# Patient Record
Sex: Female | Born: 1990 | Race: White | Hispanic: No | Marital: Married | Smoking: Never smoker
Health system: Southern US, Community
[De-identification: ages and names within clinical notes are randomized; demographics above are authoritative.]

## PROBLEM LIST (undated history)

## (undated) DIAGNOSIS — F319 Bipolar disorder, unspecified: Secondary | ICD-10-CM

## (undated) DIAGNOSIS — K5190 Ulcerative colitis, unspecified, without complications: Secondary | ICD-10-CM

## (undated) DIAGNOSIS — K219 Gastro-esophageal reflux disease without esophagitis: Secondary | ICD-10-CM

---
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
## 2011-02-04 ENCOUNTER — Ambulatory Visit: Payer: Self-pay | Admitting: Gastroenterology

## 2011-02-18 ENCOUNTER — Ambulatory Visit: Payer: Self-pay | Admitting: Gastroenterology

## 2011-07-26 ENCOUNTER — Emergency Department: Payer: Self-pay | Admitting: Internal Medicine

## 2011-07-26 LAB — CBC
Platelet: 340 10*3/uL (ref 150–440)
RDW: 13.1 % (ref 11.5–14.5)
WBC: 13.4 10*3/uL — ABNORMAL HIGH (ref 3.6–11.0)

## 2011-07-26 LAB — COMPREHENSIVE METABOLIC PANEL
Alkaline Phosphatase: 81 U/L (ref 50–136)
Bilirubin,Total: 1.5 mg/dL — ABNORMAL HIGH (ref 0.2–1.0)
Calcium, Total: 9.3 mg/dL (ref 8.5–10.1)
Chloride: 103 mmol/L (ref 98–107)
Co2: 21 mmol/L (ref 21–32)
EGFR (African American): >60
EGFR (Non-African Amer.): >60
SGOT(AST): 25 U/L (ref 15–37)
SGPT (ALT): 21 U/L

## 2011-07-26 LAB — URINALYSIS, COMPLETE
Nitrite: NEGATIVE
Protein: NEGATIVE
Specific Gravity: 1.026 (ref 1.003–1.030)
WBC UR: 3 /HPF (ref 0–5)

## 2011-07-26 LAB — PREGNANCY, URINE: Pregnancy Test, Urine: NEGATIVE m[IU]/mL

## 2013-08-01 ENCOUNTER — Ambulatory Visit: Payer: Self-pay | Admitting: Gastroenterology

## 2013-08-02 LAB — PATHOLOGY REPORT

## 2013-08-28 ENCOUNTER — Emergency Department: Payer: Self-pay | Admitting: Emergency Medicine

## 2013-08-28 LAB — CBC WITH DIFFERENTIAL/PLATELET
BASOS ABS: 0 10*3/uL (ref 0.0–0.1)
BASOS PCT: 0.4 %
EOS PCT: 0.6 %
Eosinophil #: 0 10*3/uL (ref 0.0–0.7)
HCT: 35.2 % (ref 35.0–47.0)
HGB: 11.5 g/dL — ABNORMAL LOW (ref 12.0–16.0)
Lymphocyte #: 1.4 10*3/uL (ref 1.0–3.6)
Lymphocyte %: 20.4 %
MCH: 26.5 pg (ref 26.0–34.0)
MCHC: 32.6 g/dL (ref 32.0–36.0)
MCV: 81 fL (ref 80–100)
MONO ABS: 1.1 x10 3/mm — AB (ref 0.2–0.9)
Monocyte %: 16.8 %
NEUTROS PCT: 61.8 %
Neutrophil #: 4.1 10*3/uL (ref 1.4–6.5)
Platelet: 294 10*3/uL (ref 150–440)
RBC: 4.34 10*6/uL (ref 3.80–5.20)
RDW: 13.8 % (ref 11.5–14.5)
WBC: 6.6 10*3/uL (ref 3.6–11.0)

## 2013-08-28 LAB — COMPREHENSIVE METABOLIC PANEL
ALK PHOS: 70 U/L
ALT: 29 U/L (ref 12–78)
ANION GAP: 8 (ref 7–16)
AST: 33 U/L (ref 15–37)
Albumin: 3.4 g/dL (ref 3.4–5.0)
BUN: 9 mg/dL (ref 7–18)
Bilirubin,Total: 0.7 mg/dL (ref 0.2–1.0)
CHLORIDE: 108 mmol/L — AB (ref 98–107)
Calcium, Total: 8.1 mg/dL — ABNORMAL LOW (ref 8.5–10.1)
Co2: 22 mmol/L (ref 21–32)
Creatinine: 0.64 mg/dL (ref 0.60–1.30)
EGFR (Non-African Amer.): >60
GLUCOSE: 95 mg/dL (ref 65–99)
OSMOLALITY: 274 (ref 275–301)
Potassium: 3.3 mmol/L — ABNORMAL LOW (ref 3.5–5.1)
SODIUM: 138 mmol/L (ref 136–145)
TOTAL PROTEIN: 7.8 g/dL (ref 6.4–8.2)

## 2013-08-28 LAB — URINALYSIS, COMPLETE
BACTERIA: NONE SEEN
BILIRUBIN, UR: NEGATIVE
GLUCOSE, UR: NEGATIVE mg/dL (ref 0–75)
Ketone: NEGATIVE
Nitrite: NEGATIVE
PH: 6 (ref 4.5–8.0)
PROTEIN: NEGATIVE
RBC,UR: 367 /HPF (ref 0–5)
Specific Gravity: 1.014 (ref 1.003–1.030)
Squamous Epithelial: 2

## 2013-08-28 LAB — LIPASE, BLOOD: Lipase: 118 U/L (ref 73–393)

## 2013-09-27 LAB — CBC
HCT: 35.9 % (ref 35.0–47.0)
HGB: 11.8 g/dL — ABNORMAL LOW (ref 12.0–16.0)
MCH: 26.9 pg (ref 26.0–34.0)
MCHC: 32.9 g/dL (ref 32.0–36.0)
MCV: 82 fL (ref 80–100)
Platelet: 344 10*3/uL (ref 150–440)
RBC: 4.4 10*6/uL (ref 3.80–5.20)
RDW: 13.7 % (ref 11.5–14.5)
WBC: 10.4 10*3/uL (ref 3.6–11.0)

## 2013-09-27 LAB — URINALYSIS, COMPLETE
Bilirubin,UR: NEGATIVE
Glucose,UR: NEGATIVE mg/dL (ref 0–75)
Ketone: NEGATIVE
NITRITE: NEGATIVE
PH: 7 (ref 4.5–8.0)
PROTEIN: NEGATIVE
RBC,UR: NONE SEEN /HPF (ref 0–5)
Specific Gravity: 1.004 (ref 1.003–1.030)
WBC UR: 4 /HPF (ref 0–5)

## 2013-09-27 LAB — DRUG SCREEN, URINE

## 2013-09-27 LAB — ETHANOL: Ethanol: <3 mg/dL

## 2013-09-27 LAB — COMPREHENSIVE METABOLIC PANEL
ALT: 21 U/L (ref 12–78)
ANION GAP: 6 — AB (ref 7–16)
Albumin: 3.2 g/dL — ABNORMAL LOW (ref 3.4–5.0)
Alkaline Phosphatase: 66 U/L
BUN: 8 mg/dL (ref 7–18)
Bilirubin,Total: 0.7 mg/dL (ref 0.2–1.0)
CREATININE: 0.55 mg/dL — AB (ref 0.60–1.30)
Calcium, Total: 8.3 mg/dL — ABNORMAL LOW (ref 8.5–10.1)
Chloride: 107 mmol/L (ref 98–107)
Co2: 25 mmol/L (ref 21–32)
EGFR (African American): >60
EGFR (Non-African Amer.): >60
Glucose: 107 mg/dL — ABNORMAL HIGH (ref 65–99)
Osmolality: 274 (ref 275–301)
Potassium: 3.7 mmol/L (ref 3.5–5.1)
SGOT(AST): 29 U/L (ref 15–37)
Sodium: 138 mmol/L (ref 136–145)
TOTAL PROTEIN: 7.7 g/dL (ref 6.4–8.2)

## 2013-09-27 LAB — PREGNANCY, URINE: Pregnancy Test, Urine: NEGATIVE m[IU]/mL

## 2013-09-27 LAB — SALICYLATE LEVEL: Salicylates, Serum: <1.7 mg/dL

## 2013-09-27 LAB — TSH: Thyroid Stimulating Horm: 2.66 u[IU]/mL

## 2013-09-27 LAB — ACETAMINOPHEN LEVEL

## 2013-09-28 ENCOUNTER — Inpatient Hospital Stay: Payer: Self-pay | Admitting: Psychiatry

## 2014-10-07 NOTE — Discharge Summary (Signed)
PATIENT NAME:  Whitney Tanner, Whitney Tanner MR#:  [id number] DATE OF BIRTH:  May 06, 1991  DATE OF ADMISSION:  09/28/2013 DATE OF DISCHARGE:  09/30/2013  HOSPITAL COURSE: See dictated history and physical for details of admission. This 24 year old woman came into the hospital after taking an overdose of Ambien. Reported depressive symptoms and anxious symptoms. In the hospital, she was treated with medication and psychotherapy. She did not display any dangerous or violent behavior in the hospital. She has denied suicidal ideation since being in the hospital. Medically, she has continued to take her medicine for her chronic GI complaints and also has started fluoxetine 10 mg a day for her anxiety and depressive symptoms. She is not having any side effects of it. She is to follow up with [facility] and is agreeable to the treatment plan. The patient has shown improved affect and improved insight. At the time of discharge, does not appear to be an acute danger to self and does agree to followup plan.   DISPOSITION: Discharge home with family. Follow up at [facility].   MENTAL STATUS EXAM: Neatly dressed and groomed woman, looks her stated age, cooperative with the interview. Good eye contact, normal psychomotor activity. Speech normal rate, tone and volume. Affect euthymic, reactive, appropriate. Mood stated as okay. Thoughts lucid. No loosening of associations. Denies auditory or visual hallucinations. Denies suicidal or homicidal ideation. Shows adequate judgment and insight. Normal intelligence. Alert and oriented x 4. Short-term and long-term memory intact.   DISCHARGE MEDICATIONS: Sucralfate 1 g 2 times a day, pantoprazole 40 mg once a day, balsalazide 1.1 g twice a day, fluoxetine 10 mg once a day, trazodone 100 mg a day, promethazine 25 mg as needed for vomiting, ondansetron 4 mg every 4 hours as needed for vomiting.   LABORATORY RESULTS: Admission labs included a pregnancy test that was negative.  Urinalysis unremarkable. Drug screen negative. EKG unremarkable. TSH normal. CBC unremarkable. Alcohol negative. No major abnormalities on the chemistry profile.   DIAGNOSIS, PRINCIPAL AND PRIMARY:  AXIS I: Major depression, single, severe.   SECONDARY DIAGNOSES:  AXIS I:  1. Social anxiety disorder.  2. Panic disorder with agoraphobia.   AXIS II: Deferred.  AXIS III: Ulcerative colitis.  AXIS IV: Severe from financial and personal problems.  AXIS V: Functioning at time of discharge 53.    ____________________________ Audery Amel, MD jtc:gb D: 10/20/2013 17:57:14 ET T: 10/21/2013 03:12:07 ET JOB#: [id number]  cc: Audery Amel, MD, <Dictator> Audery Amel MD ELECTRONICALLY SIGNED 10/24/2013 0:40

## 2014-10-07 NOTE — H&P (Signed)
PATIENT NAME:  Whitney Tanner, NESTA MR#:  [id number] DATE OF BIRTH:  06-08-1991  DATE OF ADMISSION:  09/28/2013   IDENTIFYING INFORMATION AND CHIEF COMPLAINT: A 24 year old woman brought to the Emergency Room after taking an overdose of Ambien.  Chief complaint, "suicide attempt."   HISTORY OF PRESENT ILLNESS: The patient took about 16 of her 5 mg Ambien tablets last night. She does not think she took any other pills with it. She was having actual suicidal intent when she took the pills. After she did it, she drove over to her boyfriend's house, however, and at some point blacked out and was brought to the hospital. Recently, she describes multiple symptoms of depression and anxiety. As far as depression, her mood has been sad, down and hopeless with worsening symptoms for the last couple of months. Very poor sleep, which is interrupted at night. Lots of negative thoughts and hopelessness. More frequent suicidal ideation recently. The patient has anxiety symptoms, which have been present for years, but have been getting worse for the last several months. She gets panic attack-like symptoms whenever she gets in a car to drive anywhere or when she leaves her house. She has, apparently, developed some degree of agoraphobia. Also describes chronic but worsening social anxiety symptoms and some degree of obsessive worry. Does not report psychotic symptoms. Not abusing drugs.   PAST PSYCHIATRIC HISTORY: She reports that her anxiety symptoms have been present for years. She has never had any treatment in the past, however. No previous hospitalizations. No previous suicide attempts. She talked with her primary care doctor, who was going to refer her to a psychiatrist, but she had not gotten the referral yet.   FAMILY HISTORY: Multiple people with depression and anxiety problems.   SOCIAL HISTORY: Unmarried, but has a boyfriend. 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 Lost  her job because of a combination of her medical problems and her worsening concentration and ability to do her work related to anxiety and depression. Has had previous jobs also that she has lost in this way. Recently has been living with her sister, but has no income now and feels like she does not have any place to live.   PAST MEDICAL HISTORY: The patient has ulcerative colitis, which is mildly to moderately active. She has daily symptoms with nausea and heartburn symptoms of esophagitis and colitis. Lots of discomfort. She has never had any surgery for it yet. She sees Dr. Marva Panda in gastroenterology. She is currently taking balsalazide, as well as Carafate and pantoprazole.   SUBSTANCE ABUSE HISTORY: Does not drink at all.  Does not abuse any other drugs. Also does not smoke.   REVIEW OF SYSTEMS: Depressed mood, poor sleep, fatigue, lack of interest in usual activities. General feeling of hopelessness. Some passive suicidal ideation. No hallucinations or delusions. Chronic esophagitis symptoms with stomach discomfort, occasional bouts of nausea and vomiting. The rest of the review of systems is negative.   MENTAL STATUS EXAMINATION: Neatly dressed and groomed woman who looks her stated age, cooperative with the interview. Good eye contact. Normal psychomotor activity. Speech is normal rate, tone and volume. Affect is blunted, dysphoric, tearful at times, but appropriate. Mood is stated as anxious and depressed. Thoughts are lucid without loosening of associations. No sign of delusional thinking. No sign of auditory or visual hallucinations. Denies active suicidal intent, but has some passive hopelessness and suicidal ideation. No homicidal ideation. Judgment and insight are  improving. Alert and oriented x 4. Short- and long-term memory intact to testing. Normal fund of knowledge.   PHYSICAL EXAMINATION: GENERAL: The patient does not appear to be in any acute physical distress.  SKIN: No skin  lesions identified.  HEENT: Pupils equal and reactive. Face symmetric. Oral mucosa normal.  NECK AND BACK: Nontender.  NEUROLOGIC: Full range of motion at all extremities. Normal gait. Strength and reflexes symmetric and normal throughout. Cranial nerves symmetric and normal.  LUNGS: Clear with no wheezes.  HEART: Regular rate and rhythm.  ABDOMEN: Soft, nontender, normal bowel sounds.  VITAL SIGNS: Show a temperature 98.7, pulse 101, respirations 18, blood pressure 107/72.   LABORATORY RESULTS: Very slightly anemic with a hemoglobin of 11.8, but normal hematocrit. Creatinine low at 0.55. Calcium low at 8.3. TSH normal. Drug screen negative. Urinalysis: Slight blood. No sign of infection. Negative pregnancy test. Negative Tylenol and salicylates.   ASSESSMENT: This is a 24 year old woman with major depression and mixed anxiety disorder with social anxiety disorder and panic disorder with agoraphobia, probably both meeting criteria. Not currently getting any treatment. Made a serious suicide attempt. Needs hospitalization for stabilization, also has significant chronic medical problems.   TREATMENT PLAN: Continue all of her usual medicines for ulcerative colitis. I suggested to her that we start fluoxetine 10 mg a day for her psychiatric symptoms. Engage her in daily individual and group psychotherapy. Work on monitoring for improvement in symptoms and decrease of suicidal ideation and work on discharge planning.   DIAGNOSIS, PRINCIPAL AND PRIMARY:  AXIS I: Major depression, single, severe.   SECONDARY DIAGNOSES: AXIS I:  1.  Social anxiety disorder.  2.  Panic disorder with agoraphobia.  AXIS II: Deferred.  AXIS III: Ulcerative colitis.  AXIS IV: Severe from being out of work; no money, chronic illness.  AXIS V: Functioning at time of evaluation 28.     ____________________________ Audery Amel, MD jtc:dmm D: 09/28/2013 13:47:04 ET T: 09/28/2013 14:04:34  ET JOB#: [id number]  cc: Audery Amel, MD, <Dictator> Audery Amel MD ELECTRONICALLY SIGNED 09/29/2013 9:53

## 2015-01-24 ENCOUNTER — Encounter: Payer: Self-pay | Admitting: *Deleted

## 2015-01-24 ENCOUNTER — Emergency Department
Admission: EM | Admit: 2015-01-24 | Discharge: 2015-01-24 | Disposition: A | Discharge status: Home / Self Care | Payer: 59 | Attending: Emergency Medicine | Admitting: Emergency Medicine

## 2015-01-24 DIAGNOSIS — K625 Hemorrhage of anus and rectum: Secondary | ICD-10-CM | POA: Diagnosis not present

## 2015-01-24 DIAGNOSIS — Z3202 Encounter for pregnancy test, result negative: Secondary | ICD-10-CM | POA: Insufficient documentation

## 2015-01-24 HISTORY — DX: Bipolar disorder, unspecified: F31.9

## 2015-01-24 HISTORY — DX: Gastro-esophageal reflux disease without esophagitis: K21.9

## 2015-01-24 HISTORY — DX: Ulcerative colitis, unspecified, without complications: K51.90

## 2015-01-24 LAB — CBC WITH DIFFERENTIAL/PLATELET
Basophils Absolute: 0.1 10*3/uL (ref 0–0.1)
Basophils Relative: 1 %
EOS ABS: 0.1 10*3/uL (ref 0–0.7)
EOS PCT: 1 %
HCT: 34.8 % — ABNORMAL LOW (ref 35.0–47.0)
Hemoglobin: 11.7 g/dL — ABNORMAL LOW (ref 12.0–16.0)
LYMPHS PCT: 37 %
Lymphs Abs: 4.4 10*3/uL — ABNORMAL HIGH (ref 1.0–3.6)
MCH: 28.4 pg (ref 26.0–34.0)
MCHC: 33.7 g/dL (ref 32.0–36.0)
MCV: 84.3 fL (ref 80.0–100.0)
MONOS PCT: 8 %
Monocytes Absolute: 0.9 10*3/uL (ref 0.2–0.9)
NEUTROS PCT: 53 %
Neutro Abs: 6.3 10*3/uL (ref 1.4–6.5)
PLATELETS: 316 10*3/uL (ref 150–400)
RBC: 4.13 MIL/uL (ref 3.80–5.20)
RDW: 13.5 % (ref 11.5–14.5)
WBC: 11.8 10*3/uL — ABNORMAL HIGH (ref 3.6–11.0)

## 2015-01-24 LAB — BASIC METABOLIC PANEL
Anion gap: 8 (ref 5–15)
BUN: 11 mg/dL (ref 6–20)
CALCIUM: 8.9 mg/dL (ref 8.9–10.3)
CHLORIDE: 106 mmol/L (ref 101–111)
CO2: 25 mmol/L (ref 22–32)
Creatinine, Ser: 0.6 mg/dL (ref 0.44–1.00)
GFR calc Af Amer: >60 mL/min (ref >60)
GFR calc non Af Amer: >60 mL/min (ref >60)
Glucose, Bld: 96 mg/dL (ref 65–99)
Potassium: 3.7 mmol/L (ref 3.5–5.1)
Sodium: 139 mmol/L (ref 135–145)

## 2015-01-24 LAB — POCT PREGNANCY, URINE: PREG TEST UR: NEGATIVE

## 2015-01-24 NOTE — ED Notes (Signed)
Pt presents to ED with c/o rectal bleeding r/t UC (pt was d/x'd with UC in 2010). Pt reports dark red clots with BRB every with her BMs. Pt is A&O, in NAD, with sister at bedside.

## 2015-01-24 NOTE — ED Provider Notes (Signed)
[facility] Emergency Department Provider Note    ____________________________________________  Time seen: 1950  I have reviewed the triage vital signs and the nursing notes.   HISTORY  Chief Complaint Rectal Bleeding   History limited by: Not Limited   HPI Whitney Tanner is a 24 y.o. female with a history of ulcerative colitis who presents to the emergency department today because of concerns for rectal bleeding. She states that she has been having some bleeding for the past 2 weeks. This in itself is not that unusual for her ulcerative colitis. The patient does state however that today she started having much heavier bleeding. She also noticed some clots. The patient states that her pain is not any different than her normal straight of colitis pain. She does however feel slightly more fatigued than normal.     Past Medical History  Diagnosis Date  . Ulcerative colitis   . Bipolar 1 disorder   . GERD (gastroesophageal reflux disease)     There are no active problems to display for this patient.   History reviewed. No pertinent past surgical history.  No current outpatient prescriptions on file.  Allergies Review of patient's allergies indicates no known allergies.  No family history on file.  Social History Social History  Substance Use Topics  . Smoking status: Never Smoker   . Smokeless tobacco: None  . Alcohol Use: No    Review of Systems  Constitutional: Negative for fever. Cardiovascular: Negative for chest pain. Respiratory: Negative for shortness of breath. Gastrointestinal: Negative for abdominal pain, vomiting and diarrhea. Genitourinary: Negative for dysuria. Musculoskeletal: Negative for back pain. Skin: Negative for rash. Neurological: Negative for headaches, focal weakness or numbness.   10-point ROS otherwise negative.  ____________________________________________   PHYSICAL EXAM:  VITAL SIGNS: ED  Triage Vitals  Enc Vitals Group     BP 01/24/15 1903 134/84 mmHg     Pulse Rate 01/24/15 1903 95     Resp 01/24/15 1903 18     Temp 01/24/15 1903 98.5 F (36.9 C)     Temp Source 01/24/15 1903 Oral     SpO2 01/24/15 1903 98 %     Weight 01/24/15 1903 114 lb (51.71 kg)     Height 01/24/15 1903 5\' 7"  (1.702 m)     Head Cir --      Peak Flow --      Pain Score 01/24/15 1901 0   Constitutional: Alert and oriented. Well appearing and in no distress. Eyes: Conjunctivae are normal. PERRL. Normal extraocular movements. ENT   Head: Normocephalic and atraumatic.   Nose: No congestion/rhinnorhea.   Mouth/Throat: Mucous membranes are moist.   Neck: No stridor. Hematological/Lymphatic/Immunilogical: No cervical lymphadenopathy. Cardiovascular: Normal rate, regular rhythm.  No murmurs, rubs, or gallops. Respiratory: Normal respiratory effort without tachypnea nor retractions. Breath sounds are clear and equal bilaterally. No wheezes/rales/rhonchi. Gastrointestinal: Soft and nontender. No distention.  Genitourinary: Deferred Musculoskeletal: Normal range of motion in all extremities. No joint effusions.  No lower extremity tenderness nor edema. Neurologic:  Normal speech and language. No gross focal neurologic deficits are appreciated. Speech is normal.  Skin:  Skin is warm, dry and intact. No rash noted. Psychiatric: Mood and affect are normal. Speech and behavior are normal. Patient exhibits appropriate insight and judgment.  ____________________________________________    LABS (pertinent positives/negatives)  Labs Reviewed  CBC WITH DIFFERENTIAL/PLATELET - Abnormal; Notable for the following:    WBC 11.8 (*)    Hemoglobin 11.7 (*)  HCT 34.8 (*)    Lymphs Abs 4.4 (*)    All other components within normal limits  BASIC METABOLIC PANEL  POCT PREGNANCY, URINE      ____________________________________________   EKG  None  ____________________________________________    RADIOLOGY  None  ____________________________________________   PROCEDURES  Procedure(s) performed: None  Critical Care performed: No  ____________________________________________   INITIAL IMPRESSION / ASSESSMENT AND PLAN / ED COURSE  Pertinent labs & imaging results that were available during my care of the patient were reviewed by me and considered in my medical decision making (see chart for details).  Patient presents to the emergency department today with concerns for increased GI bleeding. Patient states that she has a history of ulcerative colitis however this bleeding has been slightly increased over baseline. Her hemoglobin today appears to be patient's baseline. No significant abdominal tenderness less I do not think emergent abdominal imaging is necessary. Discussed with patient that she should follow-up with her GI doctor.  ____________________________________________   FINAL CLINICAL IMPRESSION(S) / ED DIAGNOSES  Final diagnoses:  Rectal bleeding     Phineas Semen, MD 01/25/15 1519

## 2015-01-24 NOTE — ED Notes (Signed)
Pt reports rectal bleeding x 2 weeks with bowel movements. Bright red blood in toilet during bowel movements. Hx of ulcerative colitis.

## 2015-01-24 NOTE — Discharge Instructions (Signed)
Please seek medical attention for any high fevers, chest pain, shortness of breath, change in behavior, persistent vomiting, bloody stool or any other new or concerning symptoms.  Ulcerative Colitis Ulcerative colitis is a long lasting swelling and soreness (inflammation) of the colon (large intestine). In patients with ulcerative colitis, sores (ulcers) and inflammation of the inner lining of the colon lead to illness. Ulcerative colitis can also cause problems outside the digestive tract.  Ulcerative colitis is closely related to another condition of inflammation of the intestines called Crohn's disease. Together, they are frequently referred to as inflammatory bowel disease (IBD). Ulcerative colitis and Crohn's diseases are conditions that can last years to decades. Men and women are affected equally. They most commonly begin during adolescence and early adulthood. SYMPTOMS  Common symptoms of ulcerative colitis include rectal bleeding and diarrhea. There is a wide range of symptoms among patients with this disease depending on how severe the disease is. Some of these symptoms are:  Abdominal pain or cramping.  Diarrhea.  Fever.  Tiredness (fatigue).  Weight loss.  Night sweats.  Rectal pain.  Feeling the immediate need to have a bowel movement (rectal urgency). CAUSES  Ulcerative colitis is caused by increased activity of the immune system in the intestines. The immune system is the system that protects the body against disease such as harmful bacteria, viruses, fungi, and other foreign invaders. When the immune system overacts, it causes inflammation. The cause of the increased immune system activity is not known. This over activity causes long-lasting inflammation and ulceration. This condition may be passed down from your parents (inherited). Brothers, sisters, children, and parents of patients with IBD are more likely to develop these diseases. It is not contagious. This means you  cannot catch it from someone else. DIAGNOSIS  Your caregiver may suspect ulcerative colitis based on your symptoms and exam. Blood tests may confirm that there is a problem. You may be asked to submit a stool specimen for examination. X-rays and CT scans may be necessary. Ultimately, the diagnosis is usually made after a flexible tube is inserted via your anus and your colon is examined under sedation (colonoscopy). With this test, the specialist can take a tiny tissue sample from inside the bowel (biopsy). Examination of this biopsy tissue under a microscopy can reveal ulcerative colitis as the cause of your symptoms. TREATMENT   There is no cure for ulcerative colitis.  Complications such as massive bleeding from the colon (hemorrhage), development of a hole in the colon (perforation), or the development of precancerous or cancerous changes of the colon may require surgery.  Medications are often used to decrease inflammation and control the immune system. These include medicines related to aspirin, steroid medications, and newer and stronger medications to slow down the immune system. Some medications may be used as suppositories or enemas. A number of other medications are used or have been studied. Your caregiver will make specific recommendations. HOME CARE INSTRUCTIONS   There is no cure for ulcerative colitis disease. The best treatment is frequent checkups with your caregiver. Periodic reevaluation is important.  Symptoms such as diarrhea can be controlled with medications. Avoid foods that have a laxative effect such fresh fruit and vegetables and dairy products. During flare ups, you can rest your bowel by staying away from solid foods. Drink clear liquids frequently during the day. Electrolyte or rehydrating fluids are best. Your caregiver can help you with suggestions. Drink often to prevent dehydration. When diarrhea has cleared, eat smaller meals and  more often. Avoid food additives and  stimulants such as caffeine (coffee, tea, many sodas, or chocolate). Avoid dairy products. Enzyme supplements may help if you develop intolerance to a sugar in dairy products (lactose). Ask your caregiver or dietitian about specific dietary instructions.  If you had surgery, be sure you understand your care instructions thoroughly, including proper care of any surgical wounds.  Take any medications exactly as prescribed.  Try to maintain a positive attitude. Learn relaxation techniques such as self hypnosis, mental imaging, and muscle relaxation. If possible, avoid stresses that aggravate your condition. Exercise regularly. Follow your diet. Always get plenty of rest. SEEK MEDICAL CARE IF:   Your symptoms fail to improve after a week or two of new treatment.  You experience continued weight loss.  You have ongoing crampy digestion or loose bowels.  You develop a new skin rash, skin sores, or eye problems. SEEK IMMEDIATE MEDICAL CARE IF:   You have worsening of your symptoms or develop new symptoms.  You have an oral temperature above 102 F (38.9 C), not controlled by medicine.  You develop bloody diarrhea.  You have severe abdominal pain. Document Released: 03/12/2005 Document Revised: 08/25/2011 Document Reviewed: 02/09/2007 [facility] Patient Information 2015 [place], [place]. This information is not intended to replace advice given to you by your health care provider. Make sure you discuss any questions you have with your health care provider.

## 2015-04-24 ENCOUNTER — Encounter: Admission: RE | Payer: Self-pay | Source: Ambulatory Visit

## 2015-04-24 ENCOUNTER — Ambulatory Visit: Admission: RE | Admit: 2015-04-24 | Payer: 59 | Source: Ambulatory Visit | Admitting: Gastroenterology

## 2015-04-24 SURGERY — COLONOSCOPY WITH PROPOFOL
Anesthesia: General

## 2015-05-07 ENCOUNTER — Other Ambulatory Visit: Payer: Self-pay | Admitting: Gastroenterology

## 2015-05-07 DIAGNOSIS — E860 Dehydration: Secondary | ICD-10-CM | POA: Insufficient documentation

## 2015-05-07 DIAGNOSIS — R531 Weakness: Secondary | ICD-10-CM | POA: Diagnosis not present

## 2015-05-07 DIAGNOSIS — Z3202 Encounter for pregnancy test, result negative: Secondary | ICD-10-CM | POA: Insufficient documentation

## 2015-05-07 DIAGNOSIS — R1084 Generalized abdominal pain: Secondary | ICD-10-CM

## 2015-05-07 DIAGNOSIS — Z79899 Other long term (current) drug therapy: Secondary | ICD-10-CM | POA: Insufficient documentation

## 2015-05-07 DIAGNOSIS — R8299 Other abnormal findings in urine: Secondary | ICD-10-CM | POA: Diagnosis not present

## 2015-05-07 DIAGNOSIS — R1011 Right upper quadrant pain: Secondary | ICD-10-CM | POA: Insufficient documentation

## 2015-05-07 DIAGNOSIS — R197 Diarrhea, unspecified: Secondary | ICD-10-CM | POA: Diagnosis not present

## 2015-05-07 DIAGNOSIS — R112 Nausea with vomiting, unspecified: Secondary | ICD-10-CM | POA: Diagnosis present

## 2015-05-07 LAB — CBC WITH DIFFERENTIAL/PLATELET
BASOS ABS: 0.1 10*3/uL (ref 0–0.1)
BASOS PCT: 1 %
EOS ABS: 0.2 10*3/uL (ref 0–0.7)
Eosinophils Relative: 1 %
HEMATOCRIT: 36.5 % (ref 35.0–47.0)
HEMOGLOBIN: 12.2 g/dL (ref 12.0–16.0)
Lymphocytes Relative: 31 %
Lymphs Abs: 3.9 10*3/uL — ABNORMAL HIGH (ref 1.0–3.6)
MCH: 28.3 pg (ref 26.0–34.0)
MCHC: 33.5 g/dL (ref 32.0–36.0)
MCV: 84.4 fL (ref 80.0–100.0)
Monocytes Absolute: 1 10*3/uL — ABNORMAL HIGH (ref 0.2–0.9)
Monocytes Relative: 8 %
NEUTROS ABS: 7.3 10*3/uL — AB (ref 1.4–6.5)
NEUTROS PCT: 59 %
Platelets: 348 10*3/uL (ref 150–440)
RBC: 4.32 MIL/uL (ref 3.80–5.20)
RDW: 13.9 % (ref 11.5–14.5)
WBC: 12.4 10*3/uL — AB (ref 3.6–11.0)

## 2015-05-07 LAB — PREGNANCY, URINE: Preg Test, Ur: NEGATIVE

## 2015-05-07 NOTE — ED Notes (Signed)
Pt in with co n.v.d for 2-3 weeks saw pmd and scheduled ultrasound on the 29th, having colosnopy and endoscopy 1/3 per Dr Marva Panda.  Pt here for weakness and possible dehydration.

## 2015-05-08 ENCOUNTER — Emergency Department: Payer: 59

## 2015-05-08 ENCOUNTER — Emergency Department
Admission: EM | Admit: 2015-05-08 | Discharge: 2015-05-08 | Disposition: A | Discharge status: Home / Self Care | Payer: 59 | Attending: Emergency Medicine | Admitting: Emergency Medicine

## 2015-05-08 ENCOUNTER — Encounter: Payer: Self-pay | Admitting: Emergency Medicine

## 2015-05-08 DIAGNOSIS — R1011 Right upper quadrant pain: Secondary | ICD-10-CM

## 2015-05-08 DIAGNOSIS — R1110 Vomiting, unspecified: Secondary | ICD-10-CM

## 2015-05-08 LAB — COMPREHENSIVE METABOLIC PANEL
ALT: 40 U/L (ref 14–54)
ANION GAP: 9 (ref 5–15)
AST: 46 U/L — ABNORMAL HIGH (ref 15–41)
Albumin: 4.4 g/dL (ref 3.5–5.0)
Alkaline Phosphatase: 66 U/L (ref 38–126)
BILIRUBIN TOTAL: 0.9 mg/dL (ref 0.3–1.2)
BUN: 10 mg/dL (ref 6–20)
CHLORIDE: 106 mmol/L (ref 101–111)
CO2: 23 mmol/L (ref 22–32)
Calcium: 9.2 mg/dL (ref 8.9–10.3)
Creatinine, Ser: 0.87 mg/dL (ref 0.44–1.00)
GFR calc Af Amer: >60 mL/min (ref >60)
GLUCOSE: 107 mg/dL — AB (ref 65–99)
POTASSIUM: 3.8 mmol/L (ref 3.5–5.1)
Sodium: 138 mmol/L (ref 135–145)
Total Protein: 8.2 g/dL — ABNORMAL HIGH (ref 6.5–8.1)

## 2015-05-08 LAB — URINALYSIS COMPLETE WITH MICROSCOPIC (ARMC ONLY)
Bilirubin Urine: NEGATIVE
Glucose, UA: NEGATIVE mg/dL
HGB URINE DIPSTICK: NEGATIVE
Ketones, ur: NEGATIVE mg/dL
LEUKOCYTES UA: NEGATIVE
NITRITE: NEGATIVE
PROTEIN: NEGATIVE mg/dL
SPECIFIC GRAVITY, URINE: 1.008 (ref 1.005–1.030)
pH: 6 (ref 5.0–8.0)

## 2015-05-08 LAB — LIPASE, BLOOD: Lipase: 35 U/L (ref 11–51)

## 2015-05-08 MED ORDER — ONDANSETRON HCL 4 MG/2ML IJ SOLN
4.0000 mg | Freq: Once | INTRAMUSCULAR | Status: AC
  Administered 2015-05-08: 4 mg via INTRAVENOUS
  Filled 2015-05-08: qty 2

## 2015-05-08 MED ORDER — MORPHINE SULFATE (PF) 4 MG/ML IV SOLN
4.0000 mg | Freq: Once | INTRAVENOUS | Status: AC
  Administered 2015-05-08: 4 mg via INTRAVENOUS
  Filled 2015-05-08: qty 1

## 2015-05-08 MED ORDER — SODIUM CHLORIDE 0.9 % IV BOLUS (SEPSIS)
1000.0000 mL | Freq: Once | INTRAVENOUS | Status: AC
  Administered 2015-05-08: 1000 mL via INTRAVENOUS

## 2015-05-08 MED ORDER — PROMETHAZINE HCL 12.5 MG PO TABS: 12.5000 mg | ORAL_TABLET | Freq: Four times a day (QID) | ORAL | Status: AC | PRN

## 2015-05-08 MED ORDER — IOHEXOL 300 MG/ML  SOLN
125.0000 mL | Freq: Once | INTRAMUSCULAR | Status: AC | PRN
  Administered 2015-05-08: 125 mL via INTRAVENOUS

## 2015-05-08 MED ORDER — OXYCODONE-ACETAMINOPHEN 5-325 MG PO TABS: 1.0000 | ORAL_TABLET | ORAL | Status: AC | PRN

## 2015-05-08 MED ORDER — IOHEXOL 240 MG/ML SOLN
25.0000 mL | Freq: Once | INTRAMUSCULAR | Status: AC | PRN
  Administered 2015-05-08: 25 mL via ORAL

## 2015-05-08 NOTE — Discharge Instructions (Signed)

## 2015-05-08 NOTE — ED Notes (Signed)
Pt taken to CT.

## 2015-05-08 NOTE — ED Provider Notes (Signed)
[facility] Emergency Department Provider Note  ____________________________________________  Time seen: 12:30 AM  I have reviewed the triage vital signs and the nursing notes.   HISTORY  Chief Complaint Emesis      HPI Whitney Tanner is a 24 y.o. female with history of ulcerative colitis followed by Dr. Marva Panda presents with 2-3 week history of nausea vomiting and diarrhea accompanied by right upper/right lower quadrant abdominal pain. Patient also admits to pale stools and dark urine. Patient states that she is scheduled to have a right upper quadrant ultrasound and HIDA scan performed for concern of possible gallbladder disease. Patient presents tonight with worsening right upper right lower quadrant abdominal pain that is currently 9 out of 10 accompanied by progressive weakness with concerns for dehydration.     Past Medical History  Diagnosis Date  . Ulcerative colitis   . Bipolar 1 disorder   . GERD (gastroesophageal reflux disease)     There are no active problems to display for this patient.   No past surgical history on file.  Current Outpatient Rx  Name  Route  Sig  Dispense  Refill  . clonazePAM (KLONOPIN) 0.5 MG tablet   Oral   Take 0.5-1 mg by mouth 2 (two) times daily as needed for anxiety.         . mesalamine (LIALDA) 1.2 G EC tablet   Oral   Take 1.2 g by mouth 4 (four) times daily.         . pantoprazole (PROTONIX) 40 MG tablet   Oral   Take 40 mg by mouth daily.         Marland Kitchen PARoxetine (PAXIL) 40 MG tablet   Oral   Take 40 mg by mouth at bedtime.          . Prenatal Vit-Fe Fumarate-FA (PRENATAL MULTIVITAMIN) TABS tablet   Oral   Take 1 tablet by mouth daily.         . QUEtiapine (SEROQUEL) 400 MG tablet   Oral   Take 800 mg by mouth at bedtime.           Allergies No known drug allergies No family history on file.  Social History Social History  Substance Use Topics  . Smoking status: Never  Smoker   . Smokeless tobacco: Not on file  . Alcohol Use: No    Review of Systems  Constitutional: Negative for fever. Eyes: Negative for visual changes. ENT: Negative for sore throat. Cardiovascular: Negative for chest pain. Respiratory: Negative for shortness of breath. Gastrointestinal: Positive for abdominal pain, vomiting and diarrhea. Genitourinary: Negative for dysuria. Musculoskeletal: Negative for back pain. Skin: Negative for rash. Neurological: Negative for headaches, focal weakness or numbness.   10-point ROS otherwise negative.  ____________________________________________   PHYSICAL EXAM:  VITAL SIGNS: ED Triage Vitals  Enc Vitals Group     BP 05/07/15 2335 133/83 mmHg     Pulse Rate 05/07/15 2335 108     Resp 05/07/15 2335 18     Temp 05/07/15 2335 99 F (37.2 C)     Temp Source 05/07/15 2335 Oral     SpO2 05/07/15 2335 95 %     Weight 05/07/15 2335 220 lb (99.791 kg)     Height 05/07/15 2335  (1.702 m)     Head Cir --      Peak Flow --      Pain Score 05/07/15 2336 7     Pain Loc --  Pain Edu? --      Excl. in GC? --      Constitutional: Alert and oriented. Well appearing and in no distress. Eyes: Conjunctivae are normal. PERRL. Normal extraocular movements. ENT   Head: Normocephalic and atraumatic.   Nose: No congestion/rhinnorhea.   Mouth/Throat: Mucous membranes are moist.   Neck: No stridor. Hematological/Lymphatic/Immunilogical: No cervical lymphadenopathy. Cardiovascular: Normal rate, regular rhythm. Normal and symmetric distal pulses are present in all extremities. No murmurs, rubs, or gallops. Respiratory: Normal respiratory effort without tachypnea nor retractions. Breath sounds are clear and equal bilaterally. No wheezes/rales/rhonchi. Gastrointestinal: RUQ/RLQ pain No distention. There is no CVA tenderness. Genitourinary: deferred Musculoskeletal: Nontender with normal range of motion in all extremities. No  joint effusions.  No lower extremity tenderness nor edema. Neurologic:  Normal speech and language. No gross focal neurologic deficits are appreciated. Speech is normal.  Skin:  Skin is warm, dry and intact. No rash noted. Psychiatric: Mood and affect are normal. Speech and behavior are normal. Patient exhibits appropriate insight and judgment.  ____________________________________________    LABS (pertinent positives/negatives)  Labs Reviewed  CBC WITH DIFFERENTIAL/PLATELET - Abnormal; Notable for the following:    WBC 12.4 (*)    Neutro Abs 7.3 (*)    Lymphs Abs 3.9 (*)    Monocytes Absolute 1.0 (*)    All other components within normal limits  COMPREHENSIVE METABOLIC PANEL - Abnormal; Notable for the following:    Glucose, Bld 107 (*)    Total Protein 8.2 (*)    AST 46 (*)    All other components within normal limits  URINALYSIS COMPLETEWITH MICROSCOPIC ([facility] ONLY) - Abnormal; Notable for the following:    Color, Urine STRAW (*)    APPearance CLEAR (*)    Bacteria, UA RARE (*)    Squamous Epithelial / LPF 0-5 (*)    All other components within normal limits  LIPASE, BLOOD  PREGNANCY, URINE         RADIOLOGY  CT Abdomen Pelvis W Contrast (Final result) Result time: 05/08/15 04:11:18   Final result by Rad Results In Interface (05/08/15 04:11:18)   Narrative:   CLINICAL DATA: Subacute onset of right-sided abdominal pain, vomiting and diarrhea. Initial encounter.  EXAM: CT ABDOMEN AND PELVIS WITH CONTRAST  TECHNIQUE: Multidetector CT imaging of the abdomen and pelvis was performed using the standard protocol following bolus administration of intravenous contrast.  CONTRAST: OMNIPAQUE IOHEXOL 300 MG/ML SOLN  COMPARISON: Right upper quadrant ultrasound performed earlier today at 1:41 a.m.  FINDINGS: The visualized lung bases are clear.  There is diffuse fatty infiltration within the liver, with a 2.2 cm nonspecific mildly hyperattenuating mass  noted at the left hepatic lobe, and sparing about the gallbladder fossa. The spleen is unremarkable in appearance. The gallbladder is within normal limits. The pancreas and adrenal glands are unremarkable.  The kidneys are unremarkable in appearance. There is no evidence of hydronephrosis. No renal or ureteral stones are seen. No perinephric stranding is appreciated.  No free fluid is identified. The small bowel is unremarkable in appearance. The stomach is within normal limits. No acute vascular abnormalities are seen.  The appendix is normal in caliber, without evidence of appendicitis. The colon is unremarkable in appearance.  The bladder is mildly distended and grossly unremarkable. The uterus is unremarkable in appearance. The ovaries are relatively symmetric. No suspicious adnexal masses are seen. No inguinal lymphadenopathy is seen.  No acute osseous abnormalities are identified.  IMPRESSION: 1. No acute abnormality seen within  about the abdomen or pelvis. 2. 2.2 cm nonspecific mildly hyperattenuating mass at the left hepatic lobe. Given the patient's age, this is likely benign. Depending on the degree of clinical concern, dynamic liver protocol MRI could be considered for further evaluation. 3. Diffuse fatty infiltration within the liver.   Electronically Signed By: Roanna Raider M.D. On: 05/08/2015 04:11          US Abdomen Limited RUQ (Final result) Result time: 05/08/15 02:10:59   Procedure changed from US Abdomen Limited      Final result by Rad Results In Interface (05/08/15 02:10:59)   Narrative:   CLINICAL DATA: Acute onset of right upper quadrant abdominal pain and vomiting. Initial encounter.  EXAM: US ABDOMEN LIMITED - RIGHT UPPER QUADRANT  COMPARISON: None.  FINDINGS: Gallbladder:  The gallbladder is contracted and grossly unremarkable. No gallbladder wall thickening or pericholecystic fluid is seen. No ultrasonographic  Murphy's sign is elicited.  Common bile duct:  Diameter: 0.3 cm, within normal limits in caliber.  Liver:  No focal lesion identified. Diffusely increased parenchymal echogenicity and coarsened echotexture, compatible with fatty infiltration.  IMPRESSION: 1. No acute abnormality seen at the right upper quadrant. 2. Diffuse fatty infiltration within the liver.   Electronically Signed By: Roanna Raider M.D. On: 05/08/2015 02:10          INITIAL IMPRESSION / ASSESSMENT AND PLAN / ED COURSE  Pertinent labs & imaging results that were available during my care of the patient were reviewed by me and considered in my medical decision making (see chart for details).  Patient received IV morphine and Phenergan in the emergency department for analgesia and antiemetic with improvement of pain and nausea. In addition patient received IV normal saline Given history of physical exam concern for possible gallbladder disease as such ultrasound the abdomen was performed however the patient's gallbladder was contracted pence evaluation was incomplete. Patient then underwent a CT scan of the abdomen and pelvis which revealed no gross abnormality with the exception of a 2.2 cm nonspecific hyperattenuating mass at the left hepatic lobe. Patient was informed of all clinical findings including that mass with recommendation to follow-up with Dr. Marva Panda for further evaluation.  ____________________________________________   FINAL CLINICAL IMPRESSION(S) / ED DIAGNOSES  Final diagnoses:  Right upper quadrant pain      Darci Current, MD 05/08/15 [phone]

## 2015-05-15 ENCOUNTER — Ambulatory Visit
Admission: RE | Admit: 2015-05-15 | Discharge: 2015-05-15 | Disposition: A | Discharge status: Home / Self Care | Payer: 59 | Source: Ambulatory Visit | Attending: Gastroenterology | Admitting: Gastroenterology

## 2015-05-15 DIAGNOSIS — R1084 Generalized abdominal pain: Secondary | ICD-10-CM | POA: Insufficient documentation

## 2015-05-15 DIAGNOSIS — R112 Nausea with vomiting, unspecified: Secondary | ICD-10-CM | POA: Insufficient documentation

## 2015-05-15 DIAGNOSIS — K769 Liver disease, unspecified: Secondary | ICD-10-CM | POA: Diagnosis not present

## 2015-05-17 ENCOUNTER — Encounter
Admission: RE | Admit: 2015-05-17 | Discharge: 2015-05-17 | Disposition: A | Discharge status: Home / Self Care | Payer: 59 | Source: Ambulatory Visit | Attending: Gastroenterology | Admitting: Gastroenterology

## 2015-05-17 DIAGNOSIS — R1084 Generalized abdominal pain: Secondary | ICD-10-CM | POA: Insufficient documentation

## 2015-05-17 MED ORDER — SINCALIDE 5 MCG IJ SOLR
0.0200 ug/kg | Freq: Once | INTRAMUSCULAR | Status: AC
  Administered 2015-05-17: 2 ug via INTRAVENOUS

## 2015-05-17 MED ORDER — TECHNETIUM TC 99M MEBROFENIN IV KIT
5.3200 | PACK | Freq: Once | INTRAVENOUS | Status: AC | PRN
  Administered 2015-05-17: 5.32 via INTRAVENOUS

## 2015-05-23 ENCOUNTER — Ambulatory Visit
Admission: RE | Admit: 2015-05-23 | Discharge: 2015-05-23 | Disposition: A | Discharge status: Home / Self Care | Payer: 59 | Source: Ambulatory Visit | Attending: Gastroenterology | Admitting: Gastroenterology

## 2015-05-23 ENCOUNTER — Ambulatory Visit: Payer: 59 | Admitting: Anesthesiology

## 2015-05-23 ENCOUNTER — Encounter
Admission: RE | Disposition: A | Discharge status: Home / Self Care | Payer: Self-pay | Source: Ambulatory Visit | Attending: Gastroenterology

## 2015-05-23 ENCOUNTER — Encounter: Payer: Self-pay | Admitting: Anesthesiology

## 2015-05-23 DIAGNOSIS — K5190 Ulcerative colitis, unspecified, without complications: Secondary | ICD-10-CM | POA: Diagnosis not present

## 2015-05-23 DIAGNOSIS — R1031 Right lower quadrant pain: Secondary | ICD-10-CM | POA: Diagnosis not present

## 2015-05-23 DIAGNOSIS — R1011 Right upper quadrant pain: Secondary | ICD-10-CM | POA: Insufficient documentation

## 2015-05-23 DIAGNOSIS — K2950 Unspecified chronic gastritis without bleeding: Secondary | ICD-10-CM | POA: Insufficient documentation

## 2015-05-23 DIAGNOSIS — R110 Nausea: Secondary | ICD-10-CM | POA: Insufficient documentation

## 2015-05-23 DIAGNOSIS — K5090 Crohn's disease, unspecified, without complications: Secondary | ICD-10-CM | POA: Insufficient documentation

## 2015-05-23 DIAGNOSIS — F319 Bipolar disorder, unspecified: Secondary | ICD-10-CM | POA: Insufficient documentation

## 2015-05-23 DIAGNOSIS — R197 Diarrhea, unspecified: Secondary | ICD-10-CM | POA: Insufficient documentation

## 2015-05-23 DIAGNOSIS — K2210 Ulcer of esophagus without bleeding: Secondary | ICD-10-CM | POA: Diagnosis not present

## 2015-05-23 DIAGNOSIS — R109 Unspecified abdominal pain: Secondary | ICD-10-CM | POA: Diagnosis present

## 2015-05-23 DIAGNOSIS — Z79899 Other long term (current) drug therapy: Secondary | ICD-10-CM | POA: Insufficient documentation

## 2015-05-23 DIAGNOSIS — K219 Gastro-esophageal reflux disease without esophagitis: Secondary | ICD-10-CM | POA: Insufficient documentation

## 2015-05-23 HISTORY — PX: ESOPHAGOGASTRODUODENOSCOPY (EGD) WITH PROPOFOL: SHX5813

## 2015-05-23 HISTORY — PX: COLONOSCOPY WITH PROPOFOL: SHX5780

## 2015-05-23 LAB — POCT PREGNANCY, URINE: Preg Test, Ur: NEGATIVE

## 2015-05-23 SURGERY — COLONOSCOPY WITH PROPOFOL
Anesthesia: General

## 2015-05-23 MED ORDER — PROPOFOL 500 MG/50ML IV EMUL
INTRAVENOUS | Status: DC | PRN
  Administered 2015-05-23: 140 ug/kg/min via INTRAVENOUS

## 2015-05-23 MED ORDER — GLYCOPYRROLATE 0.2 MG/ML IJ SOLN
INTRAMUSCULAR | Status: DC | PRN
Start: 2015-05-23 — End: 2015-05-23
  Administered 2015-05-23: 0.1 mg via INTRAVENOUS

## 2015-05-23 MED ORDER — LIDOCAINE HCL (CARDIAC) 20 MG/ML IV SOLN
INTRAVENOUS | Status: DC | PRN
Start: 2015-05-23 — End: 2015-05-23
  Administered 2015-05-23: 30 mg via INTRAVENOUS

## 2015-05-23 MED ORDER — EPHEDRINE SULFATE 50 MG/ML IJ SOLN
INTRAMUSCULAR | Status: DC | PRN
  Administered 2015-05-23 (×2): 5 mg via INTRAVENOUS

## 2015-05-23 MED ORDER — FENTANYL CITRATE (PF) 100 MCG/2ML IJ SOLN
INTRAMUSCULAR | Status: DC | PRN
  Administered 2015-05-23 (×2): 25 ug via INTRAVENOUS
  Administered 2015-05-23: 50 ug via INTRAVENOUS

## 2015-05-23 MED ORDER — SODIUM CHLORIDE 0.9 % IV SOLN
INTRAVENOUS | Status: DC
  Administered 2015-05-23: 13:00:00 via INTRAVENOUS

## 2015-05-23 MED ORDER — MIDAZOLAM HCL 2 MG/2ML IJ SOLN
INTRAMUSCULAR | Status: DC | PRN
  Administered 2015-05-23: 2 mg via INTRAVENOUS

## 2015-05-23 NOTE — H&P (Signed)
Outpatient short stay form Pre-procedure 05/23/2015 1:44 PM Whitney Deem MD  Primary Physician: Leotis Shames M.D.  Reason for visit:  EGD and colonoscopy  History of present illness:  Patient is a 24 year old female presenting today for EGD and colonoscopy. She has a history of colitis in the past. She has been taking mesalamine in this regard. She had a recent CT scan during an emergency room visit that did not indicate issues in regards to Crohn's disease. However she is been having some right-sided abdominal discomfort of uncertain etiology and some change in bowel habits with increasingly loose stools. She has been taking pantoprazole as well as Lialda. She is on multiple medications in regards to bipolar issues.  She tolerated her prep well. She takes no aspirin products or blood thinning agents.    Current facility-administered medications:  .  0.9 %  sodium chloride infusion, , Intravenous, Continuous, Whitney Deem, MD  Prescriptions prior to admission  Medication Sig Dispense Refill Last Dose  . mesalamine (LIALDA) 1.2 G EC tablet Take 1.2 g by mouth 4 (four) times daily.   05/23/2015 at 700time  . oxyCODONE-acetaminophen (ROXICET) 5-325 MG tablet Take 1 tablet by mouth every 4 (four) hours as needed for severe pain. 20 tablet 0 05/22/2015 at 100am  . pantoprazole (PROTONIX) 40 MG tablet Take 40 mg by mouth daily.   05/23/2015 at 700 time  . promethazine (PHENERGAN) 12.5 MG tablet Take 1 tablet (12.5 mg total) by mouth every 6 (six) hours as needed for nausea or vomiting. 20 tablet 0 05/22/2015 at Unknown time  . sertraline (ZOLOFT) 100 MG tablet Take 100 mg by mouth daily.   05/23/2015 at 700 time  . ziprasidone (GEODON) 40 MG capsule Take 40 mg by mouth 2 (two) times daily with a meal.     . clonazePAM (KLONOPIN) 0.5 MG tablet Take 0.5-1 mg by mouth 2 (two) times daily as needed for anxiety.   Not Taking at Unknown time  . PARoxetine (PAXIL) 40 MG tablet Take 40 mg by mouth  at bedtime.    Not Taking at Unknown time  . Prenatal Vit-Fe Fumarate-FA (PRENATAL MULTIVITAMIN) TABS tablet Take 1 tablet by mouth daily.   Not Taking at Unknown time  . QUEtiapine (SEROQUEL) 400 MG tablet Take 800 mg by mouth at bedtime.   Not Taking at Unknown time     No Known Allergies   Past Medical History  Diagnosis Date  . Ulcerative colitis (HCC)   . Bipolar 1 disorder (HCC)   . GERD (gastroesophageal reflux disease)     Review of systems:      Physical Exam    Heart and lungs: Regular rate and rhythm without rub or gallop, lungs are bilaterally clear.    HEENT: Normocephalic atraumatic eyes are anicteric    Other:     Pertinant exam for procedure: Nontender nondistended bowel sounds positive normoactive    Planned proceedures: EGD and colonoscopy with indicated procedures. I have discussed the risks benefits and complications of procedures to include not limited to bleeding, infection, perforation and the risk of sedation and the patient wishes to proceed.    Whitney Deem, MD Gastroenterology 05/23/2015  1:44 PM

## 2015-05-23 NOTE — Op Note (Signed)
[facility] Gastroenterology Patient Name: Whitney Tanner Procedure Date: 05/23/2015 1:09 PM MRN: [id number] Account #: [id number] Date of Birth: [birth year] Admit Type: Outpatient Age: 24 Room: [facility] ENDO ROOM 1 Gender: Female Note Status: Finalized Procedure:         Colonoscopy Indications:       Abdominal pain in the right lower quadrant, Abdominal pain                     in the right upper quadrant, Clinically significant                     diarrhea of unexplained origin, Personal history of                     inflammatory bowel disease Providers:         Christena Deem, MD Referring MD:      Mosetta Pigeon, MD (Referring MD) Medicines:         Monitored Anesthesia Care Complications:     No immediate complications. Procedure:         Pre-Anesthesia Assessment:                    - ASA Grade Assessment: III - A patient with severe                     systemic disease.                    After obtaining informed consent, the colonoscope was                     passed under direct vision. Throughout the procedure, the                     patient's blood pressure, pulse, and oxygen saturations                     were monitored continuously. The Colonoscope was                     introduced through the anus and advanced to the the cecum,                     identified by appendiceal orifice and ileocecal valve. The                     colonoscopy was performed with moderate difficulty due to                     poor bowel prep. Successful completion of the procedure                     was aided by lavage. The patient tolerated the procedure                     well. The quality of the bowel preparation was fair. Findings:      The colon (entire examined portion) appeared normal. Attempt to intubate       the terminal ileum were unsuccessful.      The digital rectal exam was normal.      Biopsies for histology were taken with a cold forceps from the  cecum,       ascending colon, transverse colon, descending colon, sigmoid colon and  rectum for evaluation of microscopic colitis. Impression:        - The entire examined colon is normal.                    - Biopsies were taken with a cold forceps from the cecum,                     ascending colon, transverse colon, descending colon,                     sigmoid colon and rectum for evaluation of microscopic                     colitis. Recommendation:    - Discharge patient to home.                    - Perform an upper GI series and small bowel follow                     through at appointment to be scheduled. Procedure Code(s): --- Professional ---                    [phone], Colonoscopy, flexible; with biopsy, single or                     multiple Diagnosis Code(s): --- Professional ---                    R10.31, Right lower quadrant pain                    R10.11, Right upper quadrant pain                    R19.7, Diarrhea, unspecified                    Z87.19, Personal history of other diseases of the                     digestive system CPT copyright 2014 American Medical Association. All rights reserved. The codes documented in this report are preliminary and upon coder review may  be revised to meet current compliance requirements. Christena Deem, MD 05/23/2015 2:51:21 PM This report has been signed electronically. Number of Addenda: 0 Note Initiated On: 05/23/2015 1:09 PM Scope Withdrawal Time: 0 hours 19 minutes 40 seconds  Total Procedure Duration: 0 hours 27 minutes 59 seconds       [facility]

## 2015-05-23 NOTE — Transfer of Care (Signed)
Immediate Anesthesia Transfer of Care Note  Patient: Whitney Tanner  Procedure(s) Performed: Procedure(s): COLONOSCOPY WITH PROPOFOL (N/A) ESOPHAGOGASTRODUODENOSCOPY (EGD) WITH PROPOFOL (N/A)  Patient Location: PACU  Anesthesia Type:General  Level of Consciousness: awake, oriented and sedated  Airway & Oxygen Therapy: Patient Spontanous Breathing and Patient connected to nasal cannula oxygen  Post-op Assessment: Report given to RN and Post -op Vital signs reviewed and stable  Post vital signs: Reviewed stable  Last Vitals:  Filed Vitals:   05/23/15 1256  BP: 132/64  Pulse: 103  Temp: 37.1 C  Resp: 20    Complications: No apparent anesthesia complications

## 2015-05-23 NOTE — Anesthesia Preprocedure Evaluation (Signed)
Anesthesia Evaluation  Patient identified by MRN, date of birth, ID band Patient awake    Reviewed: Allergy & Precautions, H&P , NPO status , Patient's Chart, lab work & pertinent test results  History of Anesthesia Complications Negative for: history of anesthetic complications  Airway Mallampati: III  TM Distance: >3 FB Neck ROM: full    Dental no notable dental hx. (+) Teeth Intact   Pulmonary neg pulmonary ROS, neg shortness of breath,    Pulmonary exam normal breath sounds clear to auscultation       Cardiovascular Exercise Tolerance: Good (-) angina(-) Past MI and (-) DOE Normal cardiovascular exam Rhythm:regular Rate:Normal     Neuro/Psych PSYCHIATRIC DISORDERS Bipolar Disorder negative neurological ROS     GI/Hepatic Neg liver ROS, PUD, GERD  Controlled,  Endo/Other  negative endocrine ROS  Renal/GU negative Renal ROS  negative genitourinary   Musculoskeletal   Abdominal   Peds  Hematology negative hematology ROS (+)   Anesthesia Other Findings Past Medical History:   Ulcerative colitis (HCC)                                     Bipolar 1 disorder (HCC)                                     GERD (gastroesophageal reflux disease)                      History reviewed. No pertinent surgical history.     Reproductive/Obstetrics negative OB ROS                             Anesthesia Physical Anesthesia Plan  ASA: III  Anesthesia Plan: General   Post-op Pain Management:    Induction:   Airway Management Planned:   Additional Equipment:   Intra-op Plan:   Post-operative Plan:   Informed Consent: I have reviewed the patients History and Physical, chart, labs and discussed the procedure including the risks, benefits and alternatives for the proposed anesthesia with the patient or authorized representative who has indicated his/her understanding and acceptance.   Dental  Advisory Given  Plan Discussed with: Anesthesiologist, CRNA and Surgeon  Anesthesia Plan Comments:         Anesthesia Quick Evaluation

## 2015-05-23 NOTE — Anesthesia Procedure Notes (Signed)
Performed by: COOK-MARTIN, Valynn Schamberger Pre-anesthesia Checklist: Patient identified, Emergency Drugs available, Suction available and Patient being monitored Patient Re-evaluated:Patient Re-evaluated prior to inductionOxygen Delivery Method: Nasal cannula Preoxygenation: Pre-oxygenation with 100% oxygen Intubation Type: IV induction Airway Equipment and Method: Bite block Placement Confirmation: positive ETCO2 and CO2 detector     

## 2015-05-23 NOTE — Op Note (Signed)
[facility] Gastroenterology Patient Name: Whitney Tanner Procedure Date: 05/23/2015 1:10 PM MRN: [id number] Account #: [id number] Date of Birth: [birth year] Admit Type: Outpatient Age: 24 Room: [facility] ENDO ROOM 1 Gender: Female Note Status: Finalized Procedure:         Upper GI endoscopy Indications:       Abdominal pain in the right upper quadrant, Abdominal pain                     in the right lower quadrant, Nausea Providers:         Christena Deem, MD Medicines:         Monitored Anesthesia Care Complications:     No immediate complications. Procedure:         Pre-Anesthesia Assessment:                    - ASA Grade Assessment: III - A patient with severe                     systemic disease.                    After obtaining informed consent, the endoscope was passed                     under direct vision. Throughout the procedure, the                     patient's blood pressure, pulse, and oxygen saturations                     were monitored continuously. The Endoscope was introduced                     through the mouth, and advanced to the fourth part of                     duodenum. The upper GI endoscopy was accomplished without                     difficulty. The patient tolerated the procedure well. Findings:      LA Grade C (one or more mucosal breaks continuous between tops of 2 or       more mucosal folds, less than 75% circumference) esophagitis with no       bleeding was found.      The exam of the esophagus was otherwise normal.      Patchy mild inflammation characterized by erythema was found on the       greater curvature of the gastric body. Biopsies were taken with a cold       forceps for histology. Biopsies were taken with a cold forceps for       histology.      The cardia and gastric fundus were normal on retroflexion.      Patchy mucosal flattening was found in the duodenal bulb, at 2nd part of       the duodenum and at 3rd  part of the duodenum. Biopsies were taken with a       cold forceps for histology.      The exam of the duodenum was otherwise normal.      unusual orientation of the gastric vault. Impression:        - LA Grade C erosive esophagitis.                    -  Gastritis. Biopsied.                    - Flattened mucosa was found in the duodenum, not                     consistent with celiac disease. Biopsied. Recommendation:    - Use Protonix (pantoprazole) 40 mg PO BID.                    - Return to GI clinic in 3 weeks. Procedure Code(s): --- Professional ---                    [phone], Esophagogastroduodenoscopy, flexible, transoral;                     with biopsy, single or multiple Diagnosis Code(s): --- Professional ---                    K20.8, Other esophagitis                    K29.70, Gastritis, unspecified, without bleeding                    K31.89, Other diseases of stomach and duodenum                    R10.11, Right upper quadrant pain                    R10.31, Right lower quadrant pain                    R11.0, Nausea CPT copyright 2014 American Medical Association. All rights reserved. The codes documented in this report are preliminary and upon coder review may  be revised to meet current compliance requirements. Christena Deem, MD 05/23/2015 2:12:34 PM This report has been signed electronically. Number of Addenda: 0 Note Initiated On: 05/23/2015 1:10 PM      [facility]

## 2015-05-23 NOTE — Anesthesia Postprocedure Evaluation (Signed)
Anesthesia Post Note  Patient: Whitney Tanner  Procedure(s) Performed: Procedure(s) (LRB): COLONOSCOPY WITH PROPOFOL (N/A) ESOPHAGOGASTRODUODENOSCOPY (EGD) WITH PROPOFOL (N/A)  Patient location during evaluation: PACU Anesthesia Type: General Level of consciousness: awake and alert Pain management: pain level controlled Vital Signs Assessment: post-procedure vital signs reviewed and stable Respiratory status: spontaneous breathing, nonlabored ventilation, respiratory function stable and patient connected to nasal cannula oxygen Cardiovascular status: blood pressure returned to baseline and stable Postop Assessment: no signs of nausea or vomiting Anesthetic complications: no    Last Vitals:  Filed Vitals:   05/23/15 1509 05/23/15 1519  BP: 123/67 117/74  Pulse: 101 92  Temp:    Resp: 14 11    Last Pain: There were no vitals filed for this visit.               Cleda Mccreedy Dreonna Hussein

## 2015-05-28 LAB — SURGICAL PATHOLOGY

## 2015-06-07 ENCOUNTER — Encounter: Payer: Self-pay | Admitting: Emergency Medicine

## 2015-06-07 ENCOUNTER — Emergency Department
Admission: EM | Admit: 2015-06-07 | Discharge: 2015-06-07 | Disposition: A | Discharge status: Home / Self Care | Payer: 59 | Attending: Student | Admitting: Student

## 2015-06-07 DIAGNOSIS — F319 Bipolar disorder, unspecified: Secondary | ICD-10-CM | POA: Insufficient documentation

## 2015-06-07 DIAGNOSIS — Z79899 Other long term (current) drug therapy: Secondary | ICD-10-CM | POA: Insufficient documentation

## 2015-06-07 DIAGNOSIS — F329 Major depressive disorder, single episode, unspecified: Secondary | ICD-10-CM | POA: Diagnosis present

## 2015-06-07 DIAGNOSIS — Z3202 Encounter for pregnancy test, result negative: Secondary | ICD-10-CM | POA: Insufficient documentation

## 2015-06-07 DIAGNOSIS — F3162 Bipolar disorder, current episode mixed, moderate: Secondary | ICD-10-CM

## 2015-06-07 LAB — CBC WITH DIFFERENTIAL/PLATELET
BASOS ABS: 0.1 10*3/uL (ref 0–0.1)
Basophils Relative: 1 %
Eosinophils Absolute: 0.2 10*3/uL (ref 0–0.7)
Eosinophils Relative: 1 %
HEMATOCRIT: 38.1 % (ref 35.0–47.0)
Hemoglobin: 12.7 g/dL (ref 12.0–16.0)
LYMPHS PCT: 26 %
Lymphs Abs: 3 10*3/uL (ref 1.0–3.6)
MCH: 27.9 pg (ref 26.0–34.0)
MCHC: 33.3 g/dL (ref 32.0–36.0)
MCV: 83.8 fL (ref 80.0–100.0)
Monocytes Absolute: 0.8 10*3/uL (ref 0.2–0.9)
Monocytes Relative: 7 %
NEUTROS ABS: 7.4 10*3/uL — AB (ref 1.4–6.5)
Neutrophils Relative %: 65 %
PLATELETS: 356 10*3/uL (ref 150–440)
RBC: 4.54 MIL/uL (ref 3.80–5.20)
RDW: 13.5 % (ref 11.5–14.5)
WBC: 11.4 10*3/uL — AB (ref 3.6–11.0)

## 2015-06-07 LAB — URINALYSIS COMPLETE WITH MICROSCOPIC (ARMC ONLY)
BILIRUBIN URINE: NEGATIVE
Glucose, UA: NEGATIVE mg/dL
Hgb urine dipstick: NEGATIVE
Ketones, ur: NEGATIVE mg/dL
NITRITE: NEGATIVE
PH: 7 (ref 5.0–8.0)
Protein, ur: NEGATIVE mg/dL
Specific Gravity, Urine: 1.005 (ref 1.005–1.030)

## 2015-06-07 LAB — COMPREHENSIVE METABOLIC PANEL
ALT: 28 U/L (ref 14–54)
ANION GAP: 8 (ref 5–15)
AST: 23 U/L (ref 15–41)
Albumin: 4.2 g/dL (ref 3.5–5.0)
Alkaline Phosphatase: 81 U/L (ref 38–126)
BILIRUBIN TOTAL: 1.2 mg/dL (ref 0.3–1.2)
BUN: 8 mg/dL (ref 6–20)
CO2: 23 mmol/L (ref 22–32)
Calcium: 9.1 mg/dL (ref 8.9–10.3)
Chloride: 106 mmol/L (ref 101–111)
Creatinine, Ser: 0.55 mg/dL (ref 0.44–1.00)
GFR calc Af Amer: >60 mL/min (ref >60)
Glucose, Bld: 127 mg/dL — ABNORMAL HIGH (ref 65–99)
POTASSIUM: 3.7 mmol/L (ref 3.5–5.1)
Sodium: 137 mmol/L (ref 135–145)
TOTAL PROTEIN: 8.2 g/dL — AB (ref 6.5–8.1)

## 2015-06-07 LAB — ETHANOL: Alcohol, Ethyl (B): <5 mg/dL (ref <5)

## 2015-06-07 LAB — POCT PREGNANCY, URINE: Preg Test, Ur: NEGATIVE

## 2015-06-07 MED ORDER — TRAZODONE HCL 100 MG PO TABS: 100.0000 mg | ORAL_TABLET | Freq: Every day | ORAL | Status: AC

## 2015-06-07 MED ORDER — ZIPRASIDONE HCL 60 MG PO CAPS: 60.0000 mg | ORAL_CAPSULE | Freq: Every day | ORAL | Status: AC

## 2015-06-07 NOTE — ED Notes (Signed)
Pt states "i am bipolar and im kinda in crisis, going back and forth between depression and hypermania." also reports being unable to sleep for a month. Calm and cooperative at this time, denies SI.

## 2015-06-07 NOTE — ED Provider Notes (Signed)
[facility] Emergency Department Provider Note  ____________________________________________  Time seen: Approximately 10:10 AM  I have reviewed the triage vital signs and the nursing notes.   HISTORY  Chief Complaint Depression    HPI Whitney Tanner is a 24 y.o. female with history of ulcerative colitis, GERD, bipolar disorder presents for evaluation of symptoms of bipolar disorder, gradual onset, constant since onset, worse over the past month. The patient reports that she has been cycling between depression and mania with poor sleep. No suicidal ideation, homicidal ideation or audiovisual hallucinations. Her psych medications were changed one month ago and she reports that her symptoms have worsened since then. She denies any abdominal pain, vomiting, diarrhea, fevers or chills, no chest pain or difficulty breathing.    Past Medical History  Diagnosis Date  . Ulcerative colitis (HCC)   . Bipolar 1 disorder (HCC)   . GERD (gastroesophageal reflux disease)     Patient Active Problem List   Diagnosis Date Noted  . Bipolar affective disorder, mixed, moderate (HCC)     Past Surgical History  Procedure Laterality Date  . Colonoscopy with propofol N/A 05/23/2015    Procedure: COLONOSCOPY WITH PROPOFOL;  Surgeon: Christena Deem, MD;  Location: [facility] ENDOSCOPY;  Service: Endoscopy;  Laterality: N/A;  . Esophagogastroduodenoscopy (egd) with propofol N/A 05/23/2015    Procedure: ESOPHAGOGASTRODUODENOSCOPY (EGD) WITH PROPOFOL;  Surgeon: Christena Deem, MD;  Location: [facility] ENDOSCOPY;  Service: Endoscopy;  Laterality: N/A;    Current Outpatient Rx  Name  Route  Sig  Dispense  Refill  . clonazePAM (KLONOPIN) 0.5 MG tablet   Oral   Take 0.5-1 mg by mouth 2 (two) times daily as needed for anxiety.         . mesalamine (LIALDA) 1.2 G EC tablet   Oral   Take 1.2 g by mouth 4 (four) times daily.         Marland Kitchen oxyCODONE-acetaminophen (ROXICET) 5-325 MG  tablet   Oral   Take 1 tablet by mouth every 4 (four) hours as needed for severe pain.   20 tablet   0   . pantoprazole (PROTONIX) 40 MG tablet   Oral   Take 40 mg by mouth daily.         Marland Kitchen PARoxetine (PAXIL) 40 MG tablet   Oral   Take 40 mg by mouth at bedtime.          . Prenatal Vit-Fe Fumarate-FA (PRENATAL MULTIVITAMIN) TABS tablet   Oral   Take 1 tablet by mouth daily.         . promethazine (PHENERGAN) 12.5 MG tablet   Oral   Take 1 tablet (12.5 mg total) by mouth every 6 (six) hours as needed for nausea or vomiting.   20 tablet   0   . QUEtiapine (SEROQUEL) 400 MG tablet   Oral   Take 800 mg by mouth at bedtime.         . sertraline (ZOLOFT) 100 MG tablet   Oral   Take 100 mg by mouth daily.         . ziprasidone (GEODON) 40 MG capsule   Oral   Take 40 mg by mouth 2 (two) times daily with a meal.           Allergies Review of patient's allergies indicates no known allergies.  History reviewed. No pertinent family history.  Social History Social History  Substance Use Topics  . Smoking status: Never Smoker   .  Smokeless tobacco: None  . Alcohol Use: No    Review of Systems Constitutional: No fever/chills Eyes: No visual changes. ENT: No sore throat. Cardiovascular: Denies chest pain. Respiratory: Denies shortness of breath. Gastrointestinal: No abdominal pain.  No nausea, no vomiting.  No diarrhea.  No constipation. Genitourinary: Negative for dysuria. Musculoskeletal: Negative for back pain. Skin: Negative for rash. Neurological: Negative for headaches, focal weakness or numbness.  10-point ROS otherwise negative.  ____________________________________________   PHYSICAL EXAM:  VITAL SIGNS: ED Triage Vitals  Enc Vitals Group     BP 06/07/15 0939 126/78 mmHg     Pulse Rate 06/07/15 0939 113     Resp 06/07/15 0939 18     Temp 06/07/15 0939 98.1 F (36.7 C)     Temp Source 06/07/15 0939 Oral     SpO2 06/07/15 0939 98 %      Weight 06/07/15 0939 215 lb (97.523 kg)     Height 06/07/15 0939 5\' 7"  (1.702 m)     Head Cir --      Peak Flow --      Pain Score --      Pain Loc --      Pain Edu? --      Excl. in GC? --     Constitutional: Alert and oriented. Well appearing and in no acute distress. Eyes: Conjunctivae are normal. PERRL. EOMI. Head: Atraumatic. Nose: No congestion/rhinnorhea. Mouth/Throat: Mucous membranes are moist.  Oropharynx non-erythematous. Neck: No stridor.  Cardiovascular: Normal rate, regular rhythm. Grossly normal heart sounds.  Good peripheral circulation. Respiratory: Normal respiratory effort.  No retractions. Lungs CTAB. Gastrointestinal: Soft and nontender. No distention.  No CVA tenderness. Genitourinary: deferred Musculoskeletal: No lower extremity tenderness nor edema.  No joint effusions. Neurologic:  Normal speech and language. No gross focal neurologic deficits are appreciated. No gait instability. Skin:  Skin is warm, dry and intact. No rash noted. Psychiatric: Mood and affect are normal. Speech and behavior are normal.  ____________________________________________   LABS (all labs ordered are listed, but only abnormal results are displayed)  Labs Reviewed  COMPREHENSIVE METABOLIC PANEL - Abnormal; Notable for the following:    Glucose, Bld 127 (*)    Total Protein 8.2 (*)    All other components within normal limits  CBC WITH DIFFERENTIAL/PLATELET - Abnormal; Notable for the following:    WBC 11.4 (*)    Neutro Abs 7.4 (*)    All other components within normal limits  URINALYSIS COMPLETEWITH MICROSCOPIC ([facility] ONLY) - Abnormal; Notable for the following:    Color, Urine STRAW (*)    APPearance HAZY (*)    Leukocytes, UA TRACE (*)    Bacteria, UA RARE (*)    Squamous Epithelial / LPF 6-30 (*)    All other components within normal limits  ETHANOL  URINE DRUG SCREEN, QUALITATIVE ([facility] ONLY)  POC URINE PREG, ED  POCT PREGNANCY, URINE    ____________________________________________  EKG  none ____________________________________________  RADIOLOGY  none ____________________________________________   PROCEDURES  Procedure(s) performed: None  Critical Care performed: No  ____________________________________________   INITIAL IMPRESSION / ASSESSMENT AND PLAN / ED COURSE  Pertinent labs & imaging results that were available during my care of the patient were reviewed by me and considered in my medical decision making (see chart for details).  Whitney Tanner is a 24 y.o. female with history of ulcerative colitis, GERD, bipolar disorder presents for evaluation of symptoms of bipolar disorder, gradual onset, constant since onset, worse over the past  month. On exam, she is very well-appearing and in no acute distress. Mildly tachycardic on arrival however this resolved without any ER intervention. She has a benign physical exam and no acute medical complaints. No indication for involuntary commitment. Labs reviewed. Normal CMP. CBC with mild leukocytosis at 11.4. Urinalysis is not consistent with infection, urine pregnant test is negative. Dr. Garnetta Buddy of psychiatry has evaluated the patient and recommends changing her medications to trazodone 100 mg daily at bedtime as well as Geodon 60 mg daily at night with food. She will follow-up with her outpatient psychiatrist. DC with return precautions and close outpatient psychiatric follow-up. She is comfortable with the discharge plan. ____________________________________________   FINAL CLINICAL IMPRESSION(S) / ED DIAGNOSES  Final diagnoses:  Bipolar 1 disorder (HCC)      Gayla Doss, MD 06/07/15 1158

## 2015-06-07 NOTE — ED Notes (Signed)
Attempt for blood draw X 2, unsuccessful. RN notified.

## 2015-06-07 NOTE — Consult Note (Signed)
[facility] Face-to-Face Psychiatry Consult   Reason for Consult:  Medication adjustment Referring Physician:  Maia Breslow M.D.  Patient Identification: Whitney Tanner MRN:  [id number] Principal Diagnosis: Bipolar disorder most recent episode mixed moderate Diagnosis: Bipolar disorder most recent episode mixed moderate.   Total Time spent with patient: 1 hour  Subjective:   Whitney Tanner is a 24 y.o. female patient presented to the ER on a voluntary basis for adjustment of her psychotropic medications. Most of the history was obtained from the patient as well as review of her chart.  HPI:    Patient is a 24 year old female who presented to the ER on a voluntary basis for adjustment of her psychotropic medications. She reported that she has [facility] [place] and he has recently adjusted her medications. She reported that she was taking a combination of Paxil and Seroquel 600-800 mg but it was stopped in November. She was gaining a lot of weight and he decided to change her medications. Patient reported that she called  Dr. Creig Hines about her medications as she was not sleeping well he did not restart the medications. He was calling her ungrateful and started her on a combination of Zoloft and Geodon.  Patient reported that she started having worsening of her mood symptoms including aggression emotionally unstable and feeling very moody. She reported that the dose of Zoloft was gradually titrated 100 mg daily. She is currently taking Geodon 40 mg twice a day. She reported that she was unable to sleep well at night. She has gained approximately 50 pounds on Seroquel as she was feeling hungry all the time. However since she stopped the Seroquel she has lost approximately 10 pounds in the past 3 weeks as she has also changed her diet. She reported that she has found another psychiatrist Dr. Corinna Capra in [facility] and has an appointment on January 18. She reported that  he came highly recommended as her mother is also seeing a therapist in the same office. She reported that she came here as she wants her medications to be adjusted and she is not able to sleep well at night. She reported that she was given trazodone the past and she responded well to the medication. She currently denied having any suicidal ideations or plans. She denied having any perceptual disturbances. She reported that she has a long history of ulcerative colitis for the past 6 years and her gastroenterologist has told her that she has stomach ulcers which are getting worse due to her stress level.  Past Psychiatric History:   Patient has previous history of suicide attempt by overdose in the past. She reported that she has history of bipolar disorder and she has responded well to a combination of Paxil and Seroquel. However she has gained 50 pounds on Seroquel. She is open to suggestions and wants her medications to be adjusted. She currently denied having any suicidal ideations or plans. She is going to start seeing a new psychiatrist in [facility] next  month.  Risk to Self: Is patient at risk for suicide?: No, but patient needs Medical Clearance Risk to Others:   Prior Inpatient Therapy:   Prior Outpatient Therapy:    Past Medical History:  Past Medical History  Diagnosis Date  . Ulcerative colitis ([place])   . Bipolar 1 disorder ([place])   . GERD (gastroesophageal reflux disease)     Past Surgical History  Procedure Laterality Date  . Colonoscopy with propofol N/A 05/23/2015  Procedure: COLONOSCOPY WITH PROPOFOL;  Surgeon: Lollie Sails, MD;  Location: [facility] ENDOSCOPY;  Service: Endoscopy;  Laterality: N/A;  . Esophagogastroduodenoscopy (egd) with propofol N/A 05/23/2015    Procedure: ESOPHAGOGASTRODUODENOSCOPY (EGD) WITH PROPOFOL;  Surgeon: Lollie Sails, MD;  Location: [facility] ENDOSCOPY;  Service: Endoscopy;  Laterality: N/A;   Family History: History reviewed. No pertinent family  history. Family Psychiatric  History: History of depression in mother and bipolar in  sister  Social History:  History  Alcohol Use No     History  Drug Use No    Social History   Social History  . Marital Status: Single    Spouse Name: N/A  . Number of Children: N/A  . Years of Education: N/A   Social History Main Topics  . Smoking status: Never Smoker   . Smokeless tobacco: None  . Alcohol Use: No  . Drug Use: No  . Sexual Activity: Not Asked   Other Topics Concern  . None   Social History Narrative   Additional Social History:         Patient currently lives by herself. She has been working in  [employer]  the past 1-1/2 year. She denied using any drugs or alcohol at this time.                  Allergies:  No Known Allergies  Labs:  Results for orders placed or performed during the hospital encounter of 06/07/15 (from the past 48 hour(s))  Comprehensive metabolic panel     Status: Abnormal   Collection Time: 06/07/15  9:44 AM  Result Value Ref Range   Sodium 137 135 - 145 mmol/L   Potassium 3.7 3.5 - 5.1 mmol/L   Chloride 106 101 - 111 mmol/L   CO2 23 22 - 32 mmol/L   Glucose, Bld 127 (H) 65 - 99 mg/dL   BUN 8 6 - 20 mg/dL   Creatinine, Ser 0.55 0.44 - 1.00 mg/dL   Calcium 9.1 8.9 - 10.3 mg/dL   Total Protein 8.2 (H) 6.5 - 8.1 g/dL   Albumin 4.2 3.5 - 5.0 g/dL   AST 23 15 - 41 U/L   ALT 28 14 - 54 U/L   Alkaline Phosphatase 81 38 - 126 U/L   Total Bilirubin 1.2 0.3 - 1.2 mg/dL   GFR calc non Af Amer >60 >60 mL/min   GFR calc Af Amer >60 >60 mL/min    Comment: (NOTE) The eGFR has been calculated using the CKD EPI equation. This calculation has not been validated in all clinical situations. eGFR's persistently <60 mL/min signify possible Chronic Kidney Disease.    Anion gap 8 5 - 15  CBC with Differential     Status: Abnormal   Collection Time: 06/07/15  9:44 AM  Result Value Ref Range   WBC 11.4 (H) 3.6 - 11.0 K/uL   RBC 4.54 3.80 - 5.20  MIL/uL   Hemoglobin 12.7 12.0 - 16.0 g/dL   HCT 38.1 35.0 - 47.0 %   MCV 83.8 80.0 - 100.0 fL   MCH 27.9 26.0 - 34.0 pg   MCHC 33.3 32.0 - 36.0 g/dL   RDW 13.5 11.5 - 14.5 %   Platelets 356 150 - 440 K/uL   Neutrophils Relative % 65 %   Neutro Abs 7.4 (H) 1.4 - 6.5 K/uL   Lymphocytes Relative 26 %   Lymphs Abs 3.0 1.0 - 3.6 K/uL   Monocytes Relative 7 %   Monocytes Absolute  0.8 0.2 - 0.9 K/uL   Eosinophils Relative 1 %   Eosinophils Absolute 0.2 0 - 0.7 K/uL   Basophils Relative 1 %   Basophils Absolute 0.1 0 - 0.1 K/uL  Urinalysis complete, with microscopic ([facility] only)     Status: Abnormal   Collection Time: 06/07/15  9:44 AM  Result Value Ref Range   Color, Urine STRAW (A) YELLOW   APPearance HAZY (A) CLEAR   Glucose, UA NEGATIVE NEGATIVE mg/dL   Bilirubin Urine NEGATIVE NEGATIVE   Ketones, ur NEGATIVE NEGATIVE mg/dL   Specific Gravity, Urine 1.005 1.005 - 1.030   Hgb urine dipstick NEGATIVE NEGATIVE   pH 7.0 5.0 - 8.0   Protein, ur NEGATIVE NEGATIVE mg/dL   Nitrite NEGATIVE NEGATIVE   Leukocytes, UA TRACE (A) NEGATIVE   RBC / HPF 0-5 0 - 5 RBC/hpf   WBC, UA 0-5 0 - 5 WBC/hpf   Bacteria, UA RARE (A) NONE SEEN   Squamous Epithelial / LPF 6-30 (A) NONE SEEN  Pregnancy, urine POC     Status: None   Collection Time: 06/07/15  9:59 AM  Result Value Ref Range   Preg Test, Ur NEGATIVE NEGATIVE    Comment:        THE SENSITIVITY OF THIS METHODOLOGY IS >24 mIU/mL     No current facility-administered medications for this encounter.   Current Outpatient Prescriptions  Medication Sig Dispense Refill  . clonazePAM (KLONOPIN) 0.5 MG tablet Take 0.5-1 mg by mouth 2 (two) times daily as needed for anxiety.    . mesalamine (LIALDA) 1.2 G EC tablet Take 1.2 g by mouth 4 (four) times daily.    Marland Kitchen oxyCODONE-acetaminophen (ROXICET) 5-325 MG tablet Take 1 tablet by mouth every 4 (four) hours as needed for severe pain. 20 tablet 0  . pantoprazole (PROTONIX) 40 MG tablet Take 40 mg by  mouth daily.    Marland Kitchen PARoxetine (PAXIL) 40 MG tablet Take 40 mg by mouth at bedtime.     . Prenatal Vit-Fe Fumarate-FA (PRENATAL MULTIVITAMIN) TABS tablet Take 1 tablet by mouth daily.    . promethazine (PHENERGAN) 12.5 MG tablet Take 1 tablet (12.5 mg total) by mouth every 6 (six) hours as needed for nausea or vomiting. 20 tablet 0  . QUEtiapine (SEROQUEL) 400 MG tablet Take 800 mg by mouth at bedtime.    . sertraline (ZOLOFT) 100 MG tablet Take 100 mg by mouth daily.    . ziprasidone (GEODON) 40 MG capsule Take 40 mg by mouth 2 (two) times daily with a meal.      Musculoskeletal: Strength & Muscle Tone: within normal limits Gait & Station: normal Patient leans: N/A  Psychiatric Specialty Exam: Review of Systems  Psychiatric/Behavioral: Positive for depression. The patient is nervous/anxious and has insomnia.     Blood pressure 126/78, pulse 113, temperature 98.1 F (36.7 C), temperature source Oral, resp. rate 18, height 5' 7"  (1.702 m), weight 215 lb (97.523 kg), last menstrual period 05/10/2015, SpO2 98 %.Body mass index is 33.67 kg/(m^2).  General Appearance: Casual  Eye Contact::  Fair  Speech:  Clear and Coherent  Volume:  Normal  Mood:  Anxious  Affect:  Congruent  Thought Process:  Coherent  Orientation:  Full (Time, Place, and Person)  Thought Content:  WDL  Suicidal Thoughts:  No  Homicidal Thoughts:  No  Memory:  Immediate;   Fair  Judgement:  Fair  Insight:  Fair  Psychomotor Activity:  Normal  Concentration:  Fair  Recall:  Fair  Fund of Knowledge:Fair  Language: Fair  Akathisia:  No  Handed:  Right  AIMS (if indicated):     Assets:  Communication Skills Desire for Improvement Physical Health Social Support  ADL's:  Intact  Cognition: WNL  Sleep:      Treatment Plan Summary: Medication management  Disposition: Patient does not meet criteria for psychiatric inpatient admission. Discussed crisis plan, support from social network, calling 911, coming to  the Emergency Department, and calling Suicide Hotline.   Discussed with patient about her medications. I will adjust the medications as follows. She will start taking Zoloft 50 mg in the morning. She has supply of the medication. Titrate Geodon 60 mg by mouth every at bedtime and she will be given the prescription she will take the medication with food She will be given a prescription of trazodone 100 mg at bedtime Patient was advised to take the medications as prescribed and to follow-up with her psychiatrist in 1 month She demonstrated understanding     This note was generated in part or whole with voice recognition software. Voice regonition is usually quite accurate but there are transcription errors that can and very often do occur. I apologize for any typographical errors that were not detected and corrected.   Thank you for allowing me to participate in the care of this patient  Rainey Pines, MD  06/07/2015 11:29 AM

## 2015-06-13 ENCOUNTER — Other Ambulatory Visit: Payer: Self-pay | Admitting: Gastroenterology

## 2015-06-13 DIAGNOSIS — R1031 Right lower quadrant pain: Secondary | ICD-10-CM

## 2015-06-13 DIAGNOSIS — R1011 Right upper quadrant pain: Secondary | ICD-10-CM

## 2015-06-21 ENCOUNTER — Ambulatory Visit
Admission: RE | Admit: 2015-06-21 | Discharge: 2015-06-21 | Disposition: A | Discharge status: Home / Self Care | Payer: 59 | Source: Ambulatory Visit | Attending: Gastroenterology | Admitting: Gastroenterology

## 2015-06-21 DIAGNOSIS — R1011 Right upper quadrant pain: Secondary | ICD-10-CM

## 2015-06-21 DIAGNOSIS — R1031 Right lower quadrant pain: Secondary | ICD-10-CM | POA: Insufficient documentation

## 2016-10-15 IMAGING — CT CT ABD-PELV W/ CM
1 of 2 series · 15 of 32 positions shown, 19 images · IV contrast (omnipaque)
Comparison: Right upper quadrant ultrasound performed earlier today
at [DATE] a.m.

CLINICAL DATA: Subacute onset of right-sided abdominal pain,
vomiting and diarrhea. Initial encounter.

EXAM:
CT ABDOMEN AND PELVIS WITH CONTRAST
TECHNIQUE: Multidetector CT imaging of the abdomen and pelvis was performed
using the standard protocol following bolus administration of
intravenous contrast.
CONTRAST:  125mL OMNIPAQUE IOHEXOL 300 MG/ML  SOLN

[Series 2: routine abd pel with · axial · 0.80mm/px · z∈[-710,-256]mm · 15 of 101 slices shown, 19 images]
[im 5/101  soft-tissue]
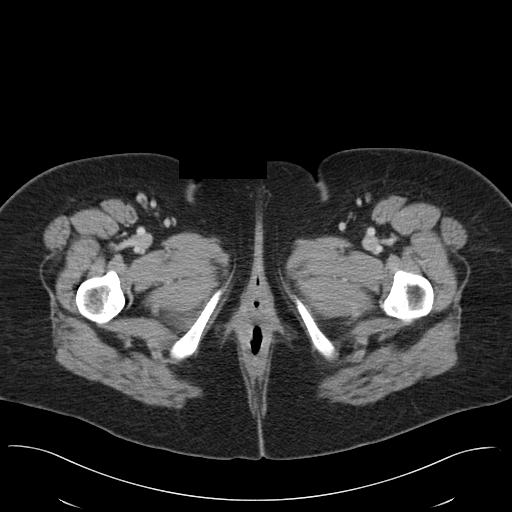
[im 5/101  bone]
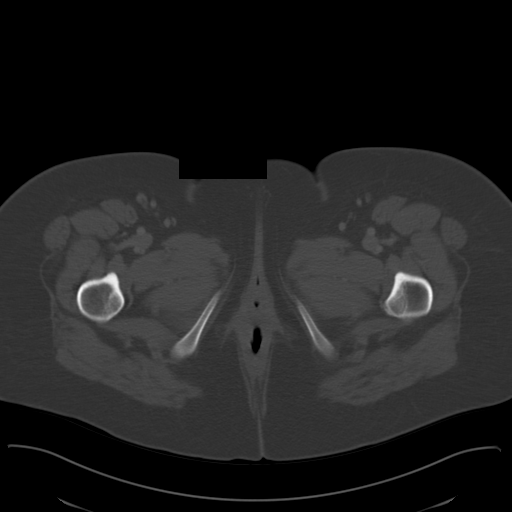
[im 13/101  soft-tissue]
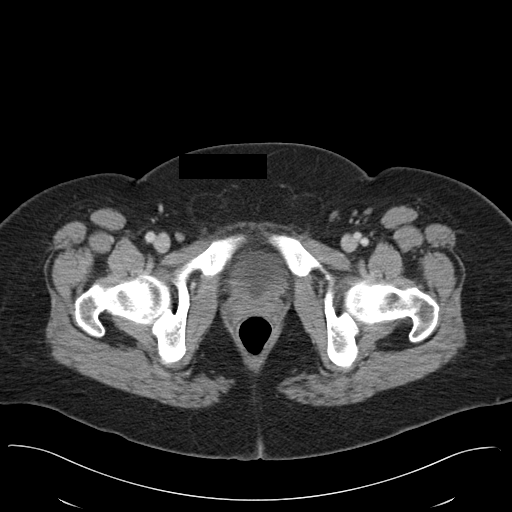
[im 21/101  soft-tissue]
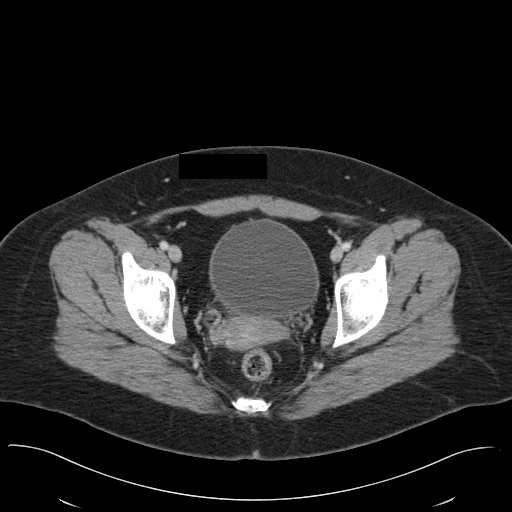
[im 30/101  soft-tissue]
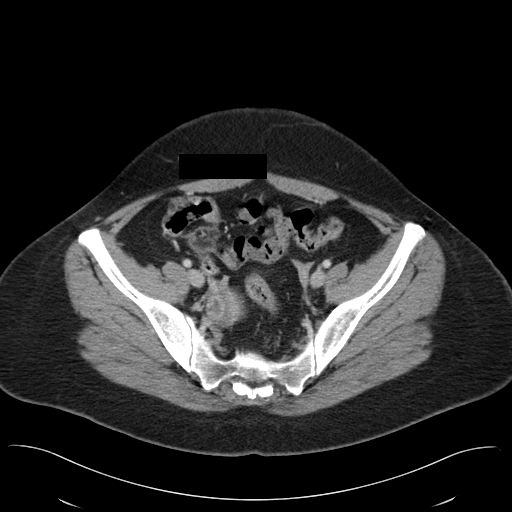
[im 34/101  soft-tissue]
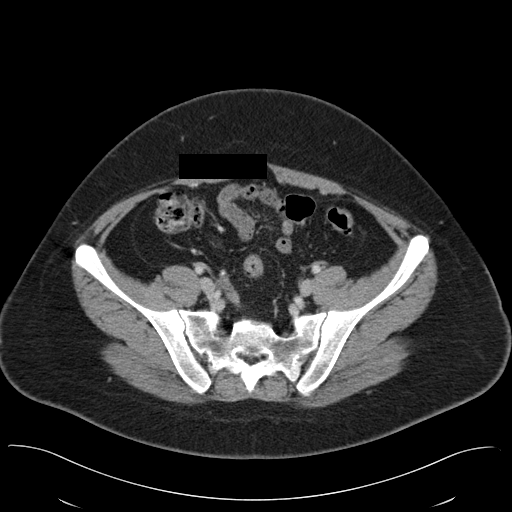
[im 42/101  soft-tissue]
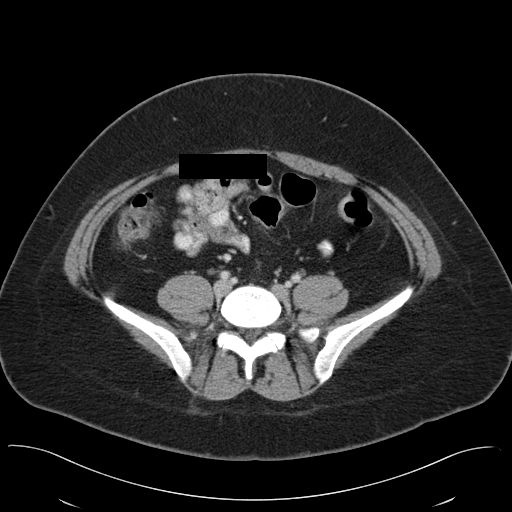
[im 51/101  soft-tissue]
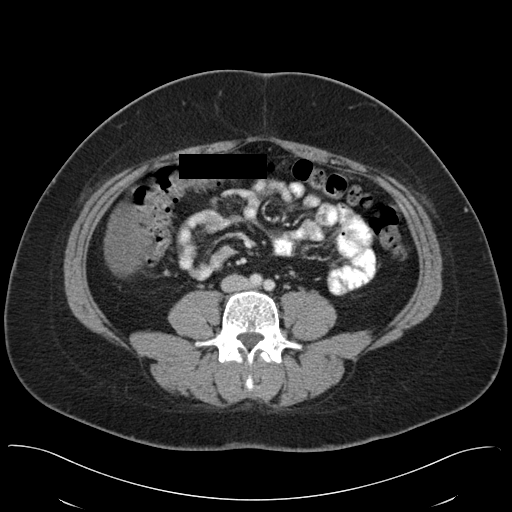
[im 59/101  soft-tissue]
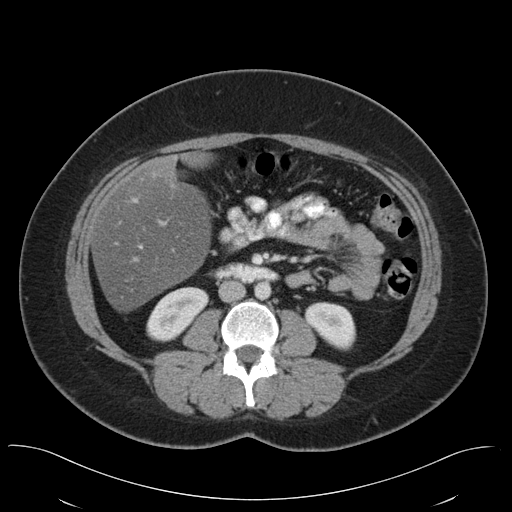
[im 67/101  soft-tissue]
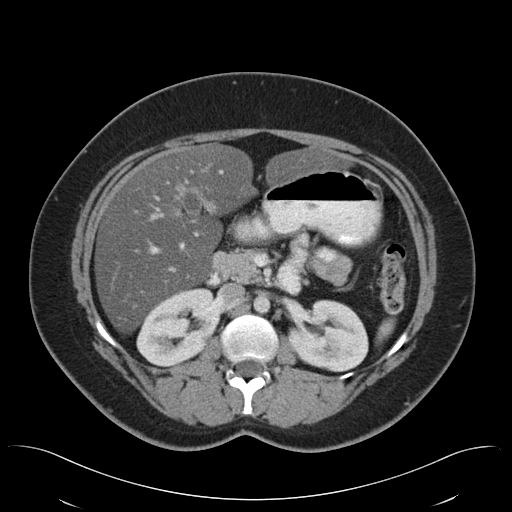
[im 67/101  bone]
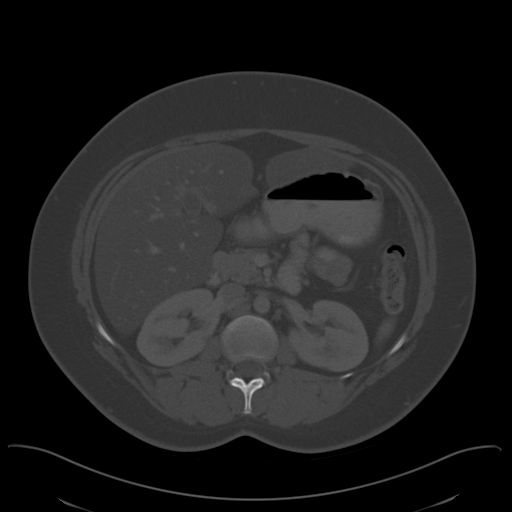
[im 71/101  soft-tissue]
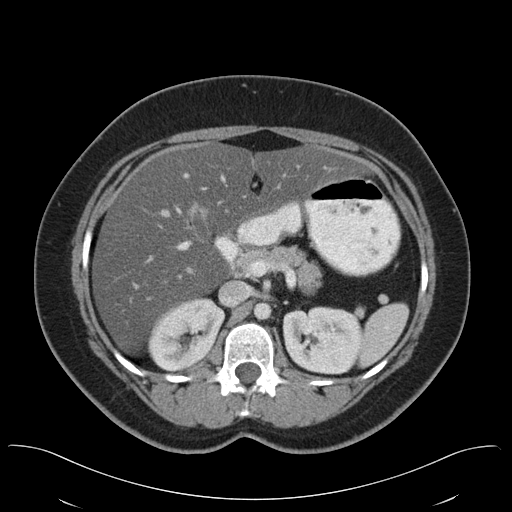
[im 80/101  soft-tissue]
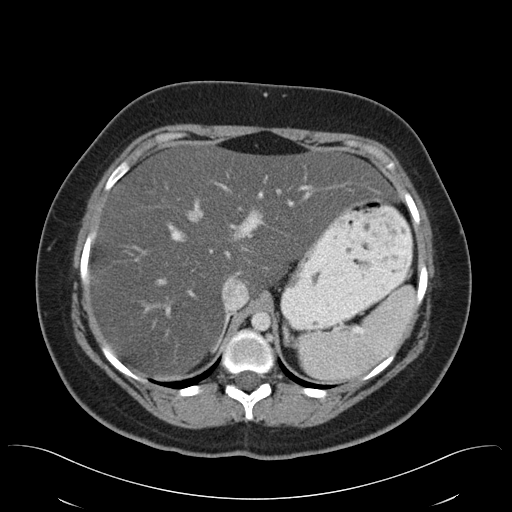
[im 84/101  lung]
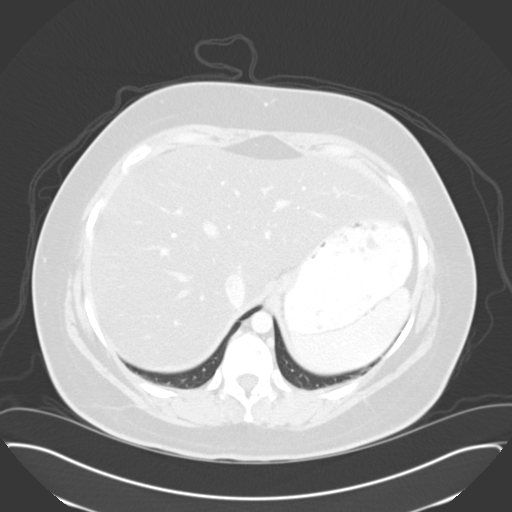
[im 88/101  soft-tissue]
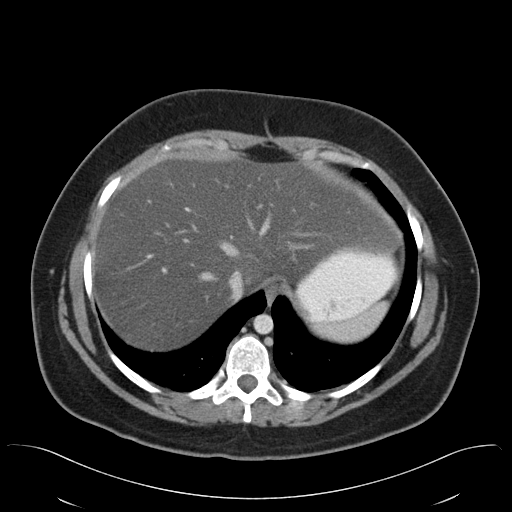
[im 88/101  lung]
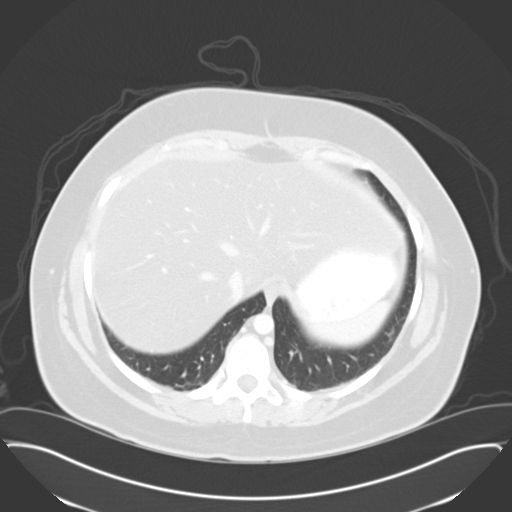
[im 92/101  lung]
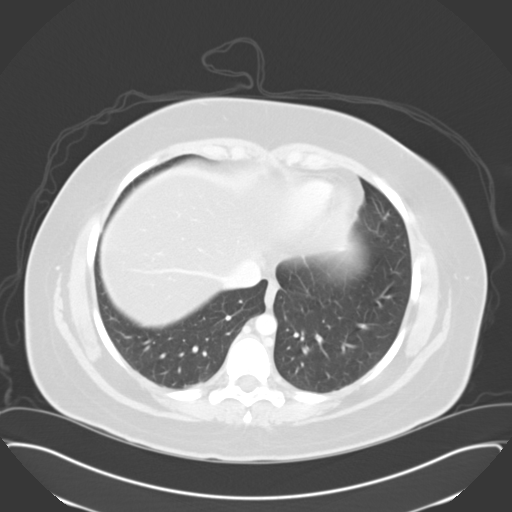
[im 96/101  soft-tissue]
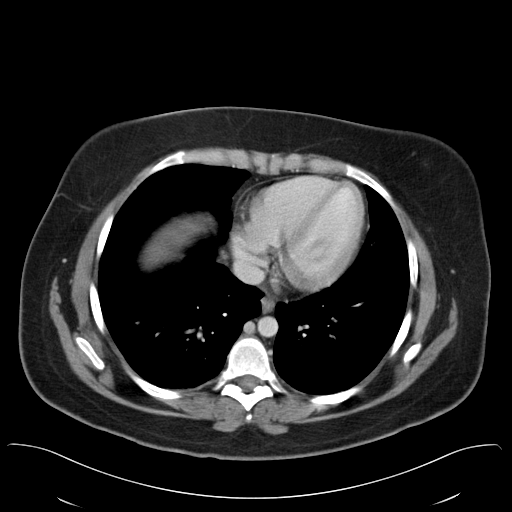
[im 96/101  lung]
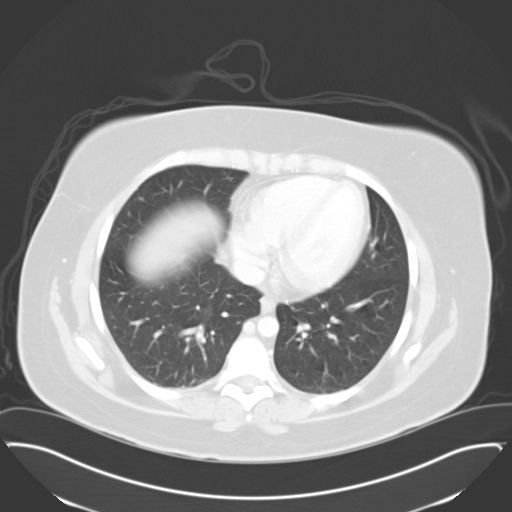

[15 of 32 positions shown; findings below may reference images not displayed]

FINDINGS: The visualized lung bases are clear.

There is diffuse fatty infiltration within the liver, with a 2.2 cm
nonspecific mildly hyperattenuating mass noted at the left hepatic
lobe, and sparing about the gallbladder fossa. The spleen is
unremarkable in appearance. The gallbladder is within normal limits.
The pancreas and adrenal glands are unremarkable.

The kidneys are unremarkable in appearance. There is no evidence of
hydronephrosis. No renal or ureteral stones are seen. No perinephric
stranding is appreciated.

No free fluid is identified. The small bowel is unremarkable in
appearance. The stomach is within normal limits. No acute vascular
abnormalities are seen.

The appendix is normal in caliber, without evidence of appendicitis.
The colon is unremarkable in appearance.

The bladder is mildly distended and grossly unremarkable. The uterus
is unremarkable in appearance. The ovaries are relatively symmetric.
No suspicious adnexal masses are seen. No inguinal lymphadenopathy
is seen.

No acute osseous abnormalities are identified.
IMPRESSION: 1. No acute abnormality seen within about the abdomen or pelvis.
[DATE] cm nonspecific mildly hyperattenuating mass at the left
hepatic lobe. Given the patient's age, this is likely benign.
Depending on the degree of clinical concern, dynamic liver protocol
MRI could be considered for further evaluation.
3. Diffuse fatty infiltration within the liver.

## 2019-07-22 ENCOUNTER — Emergency Department (HOSPITAL_COMMUNITY): Payer: HRSA Program

## 2019-07-22 ENCOUNTER — Emergency Department (HOSPITAL_COMMUNITY)
Admission: EM | Admit: 2019-07-22 | Discharge: 2019-07-22 | Disposition: A | Discharge status: Home / Self Care | Payer: HRSA Program | Attending: Emergency Medicine | Admitting: Emergency Medicine

## 2019-07-22 ENCOUNTER — Encounter (HOSPITAL_COMMUNITY): Payer: Self-pay | Admitting: Student

## 2019-07-22 ENCOUNTER — Other Ambulatory Visit: Payer: Self-pay

## 2019-07-22 DIAGNOSIS — U071 COVID-19: Secondary | ICD-10-CM | POA: Diagnosis not present

## 2019-07-22 DIAGNOSIS — Z79899 Other long term (current) drug therapy: Secondary | ICD-10-CM | POA: Insufficient documentation

## 2019-07-22 DIAGNOSIS — R05 Cough: Secondary | ICD-10-CM

## 2019-07-22 DIAGNOSIS — R059 Cough, unspecified: Secondary | ICD-10-CM

## 2019-07-22 LAB — POC SARS CORONAVIRUS 2 AG -  ED: SARS Coronavirus 2 Ag: NEGATIVE

## 2019-07-22 MED ORDER — BENZONATATE 100 MG PO CAPS: 100.0000 mg | ORAL_CAPSULE | Freq: Three times a day (TID) | ORAL | 0 refills | Status: AC

## 2019-07-22 MED ORDER — ALBUTEROL SULFATE HFA 108 (90 BASE) MCG/ACT IN AERS: 1.0000 | INHALATION_SPRAY | Freq: Four times a day (QID) | RESPIRATORY_TRACT | 0 refills | Status: AC | PRN

## 2019-07-22 MED ORDER — DOXYCYCLINE HYCLATE 100 MG PO CAPS: 100.0000 mg | ORAL_CAPSULE | Freq: Two times a day (BID) | ORAL | 0 refills | Status: AC

## 2019-07-22 NOTE — ED Provider Notes (Signed)
[facility]-EMERGENCY DEPT Provider Note   CSN: [id number] Arrival date & time: 07/22/19  1337     History Chief Complaint  Patient presents with  . covid exposure  . Cough  . Shortness of Breath    Whitney Tanner is a 29 y.o. female with a history of bipolar 1 disorder, GERD, and ulcerative colitis who presents to the emergency department with complaints of dyspnea which has been progressively worsening over the past 1 week. Patient reports that she has several sxs including chills, loss of taste/smell, subjective fevers, nasal congestion, dry cough, dyspnea, & chest pain. Chest pain is worse with coughing. No other alleviating/aggravating factors. Taking OTC congestion medicine without much relief. Her boyfriend tested positive for COVID and he seems to be getter better while she is not. Denies leg pain/swelling, hemoptysis, recent surgery/trauma, recent long travel, hormone use, personal hx of cancer, or hx of DVT/PE. Denies syncope, nausea, vomiting, abdominal pain, or melena.   HPI     Past Medical History:  Diagnosis Date  . Bipolar 1 disorder (HCC)   . GERD (gastroesophageal reflux disease)   . Ulcerative colitis [facility])     Patient Active Problem List   Diagnosis Date Noted  . Bipolar affective disorder, mixed, moderate (HCC)     Past Surgical History:  Procedure Laterality Date  . COLONOSCOPY WITH PROPOFOL N/A 05/23/2015   Procedure: COLONOSCOPY WITH PROPOFOL;  Surgeon: Christena Deem, MD;  Location: [facility] ENDOSCOPY;  Service: Endoscopy;  Laterality: N/A;  . ESOPHAGOGASTRODUODENOSCOPY (EGD) WITH PROPOFOL N/A 05/23/2015   Procedure: ESOPHAGOGASTRODUODENOSCOPY (EGD) WITH PROPOFOL;  Surgeon: Christena Deem, MD;  Location: [facility] ENDOSCOPY;  Service: Endoscopy;  Laterality: N/A;     OB History   No obstetric history on file.     No family history on file.  Social History   Tobacco Use  . Smoking status: Never Smoker  Substance Use Topics   . Alcohol use: No  . Drug use: No    Home Medications Prior to Admission medications   Medication Sig Start Date End Date Taking? Authorizing Provider  clonazePAM (KLONOPIN) 0.5 MG tablet Take 0.5-1 mg by mouth 2 (two) times daily as needed for anxiety.    [provider]  mesalamine (LIALDA) 1.2 G EC tablet Take 1.2 g by mouth 4 (four) times daily.    [provider]  oxyCODONE-acetaminophen (ROXICET) 5-325 MG tablet Take 1 tablet by mouth every 4 (four) hours as needed for severe pain. 05/08/15   Darci Current, MD  pantoprazole (PROTONIX) 40 MG tablet Take 40 mg by mouth daily.    [provider]  PARoxetine (PAXIL) 40 MG tablet Take 40 mg by mouth at bedtime.     [provider]  Prenatal Vit-Fe Fumarate-FA (PRENATAL MULTIVITAMIN) TABS tablet Take 1 tablet by mouth daily.    [provider]  promethazine (PHENERGAN) 12.5 MG tablet Take 1 tablet (12.5 mg total) by mouth every 6 (six) hours as needed for nausea or vomiting. 05/08/15   Darci Current, MD  QUEtiapine (SEROQUEL) 400 MG tablet Take 800 mg by mouth at bedtime.    [provider]  sertraline (ZOLOFT) 100 MG tablet Take 100 mg by mouth daily.    [provider]  traZODone (DESYREL) 100 MG tablet Take 1 tablet (100 mg total) by mouth at bedtime. 06/07/15   Gayla Doss, MD  ziprasidone (GEODON) 60 MG capsule Take 1 capsule (60 mg total) by mouth daily. Take with  dinner. 06/07/15   Gayla Doss, MD    Allergies    Patient has no known allergies.  Review of Systems   Review of Systems  Constitutional: Positive for chills and fever.       Positive for loss of taste/smell.   HENT: Positive for congestion. Negative for sore throat.   Respiratory: Positive for cough and shortness of breath.   Cardiovascular: Positive for chest pain. Negative for leg swelling.  Gastrointestinal: Negative for abdominal pain, blood in stool, constipation, diarrhea and vomiting.   Genitourinary: Negative for dysuria.  Neurological: Negative for syncope.  All other systems reviewed and are negative.   Physical Exam Updated Vital Signs BP (!) 180/99 (BP Location: Left Arm)   Pulse 78   Temp 98.9 F (37.2 C) (Oral)   Resp 19   Ht 5\' 7"  (1.702 m)   Wt 109.3 kg   SpO2 98%   BMI 37.75 kg/m   Physical Exam Vitals and nursing note reviewed.  Constitutional:      General: She is not in acute distress.    Appearance: She is well-developed.  HENT:     Head: Normocephalic and atraumatic.     Right Ear: Ear canal normal. Tympanic membrane is not perforated, erythematous, retracted or bulging.     Left Ear: Ear canal normal. Tympanic membrane is not perforated, erythematous, retracted or bulging.     Ears:     Comments: No mastoid erythema/swelling/tenderness.     Nose:     Right Sinus: No maxillary sinus tenderness or frontal sinus tenderness.     Left Sinus: No maxillary sinus tenderness or frontal sinus tenderness.     Mouth/Throat:     Pharynx: Uvula midline. No oropharyngeal exudate or posterior oropharyngeal erythema.     Comments: Posterior oropharynx is symmetric appearing. Patient tolerating own secretions without difficulty. No trismus. No drooling. No hot potato voice. No swelling beneath the tongue, submandibular compartment is soft.  Eyes:     General:        Right eye: No discharge.        Left eye: No discharge.     Conjunctiva/sclera: Conjunctivae normal.     Pupils: Pupils are equal, round, and reactive to light.  Cardiovascular:     Rate and Rhythm: Normal rate and regular rhythm.     Heart sounds: No murmur.  Pulmonary:     Effort: Pulmonary effort is normal. No respiratory distress.     Breath sounds: Normal breath sounds. No wheezing, rhonchi or rales.  Chest:     Chest wall: Tenderness (anterior chest wall which reproduces patient's chest pain) present. No crepitus.  Abdominal:     General: There is no distension.     Palpations:  Abdomen is soft.     Tenderness: There is no abdominal tenderness. There is no guarding or rebound.  Musculoskeletal:     Cervical back: Normal range of motion and neck supple. No edema or rigidity.     Right lower leg: No tenderness. No edema.     Left lower leg: No tenderness. No edema.  Lymphadenopathy:     Cervical: No cervical adenopathy.  Skin:    General: Skin is warm and dry.     Findings: No rash.  Neurological:     Mental Status: She is alert.  Psychiatric:        Behavior: Behavior normal.    ED Results / Procedures / Treatments   Labs (all labs ordered are listed, but  only abnormal results are displayed) Labs Reviewed  POC SARS CORONAVIRUS 2 AG -  ED    EKG None  Radiology DG Chest Port 1 View  Result Date: 07/22/2019 CLINICAL DATA:  Cough and fever to 100.2 degrees since 07/16/2019, boyfriend tested positive for COVID-19 on 07/15/2019, worsening cough, shortness of breath, hurts to breathe EXAM: PORTABLE CHEST 1 VIEW COMPARISON:  Portable exam 1436 hours without priors for comparison FINDINGS: Normal heart size, mediastinal contours, and pulmonary vascularity. Mild atelectasis versus infiltrate at medial LEFT lung base. Remaining lungs clear. No pleural effusion or pneumothorax. IMPRESSION: Atelectasis versus infiltrate at medial LEFT lower lobe. Electronically Signed   By: Ulyses Southward M.D.   On: 07/22/2019 15:03    Procedures Procedures (including critical care time)  Medications Ordered in ED Medications - No data to display  ED Course  I have reviewed the triage vital signs and the nursing notes.  Pertinent labs & imaging results that were available during my care of the patient were reviewed by me and considered in my medical decision making (see chart for details).    PATRIZIA PAULE was evaluated in Emergency Department on 07/22/2019 for the symptoms described in the history of present illness. He/she was evaluated in the context of the global COVID-19  pandemic, which necessitated consideration that the patient might be at risk for infection with the SARS-CoV-2 virus that causes COVID-19. Institutional protocols and algorithms that pertain to the evaluation of patients at risk for COVID-19 are in a state of rapid change based on information released by regulatory bodies including the CDC and federal and state organizations. These policies and algorithms were followed during the patient's care in the ED.  MDM Rules/Calculators/A&P                      Patient with recent COVID-19 exposure presents to the emergency department for evaluation of worsening shortness of breath.  She is nontoxic, resting comfortably, her initial blood pressure was quite elevated, however personally repeated this in the room with some improvement to 142/92, doubt HTN emergency.  Lungs are clear.  Patient is low risk Wells, PERC negative, doubt PE.  Her chest pain is completely reproducible with anterior chest wall palpation, suspect this is more musculoskeletal in the setting of coughing.  Her chest x-ray shows atelectasis versus infiltrate at medial left lower lobe.  Rapid Covid test is negative.  Will send PCR Covid test.  Favor coronavirus with her recent exposure with similar symptoms, however will cover for community-acquired pneumonia with doxycycline.  We will also provide additional supportive management.  She is ambulatory with an SPO2 maintaining greater than 95%, does not appear to be in respiratory distress, appears appropriate for discharge home. I discussed results, treatment plan, need for follow-up, and return precautions with the patient. Provided opportunity for questions, patient confirmed understanding and is in agreement with plan.   Final Clinical Impression(s) / ED Diagnoses Final diagnoses:  Cough    Rx / DC Orders ED Discharge Orders         Ordered    doxycycline (VIBRAMYCIN) 100 MG capsule  2 times daily     07/22/19 1537    albuterol (VENTOLIN  HFA) 108 (90 Base) MCG/ACT inhaler  Every 6 hours PRN     07/22/19 1537    benzonatate (TESSALON) 100 MG capsule  Every 8 hours     07/22/19 1537           Bernie Ransford, [employer]  R, PA-C 07/22/19 1540    Tegeler, Gwenyth Allegra, MD 07/22/19 [phone]

## 2019-07-22 NOTE — ED Triage Notes (Signed)
Per patient, her boyfriend was exposed to COVID and tested positive 07/15/19. Patient states on 07/16/19 she began having a cough and had a fever of 100.2. Patient states her cough has gotten worse and last night into today she feels as if she cannot breathe. Patient states it hurts to breathe in.

## 2019-07-22 NOTE — Discharge Instructions (Signed)
You were seen in the emergency department today for trouble breathing.  Your chest x-ray showed findings of possible pneumonia.  Your rapid Covid test was negative.  We have sent a more accurate test.  We have tested you for COVID 19, we will call you within the next 72 hours if results are positive, you may also view these results on MyChart.   We are instructing patient's with COVID 19 or symptoms of COVID 19 to quarantine themselves for 14 days. You may be able to discontinue self quarantine if the following conditions are met:   Persons with COVID-19 who have symptoms and were directed to care for themselves at home may discontinue home isolation under the  following conditions: - It has been at least 7 days have passed since symptoms first appeared. - AND at least 3 days (72 hours) have passed since recovery defined as resolution of fever without the use of fever-reducing medications and improvement in respiratory symptoms (e.g., cough, shortness of breath)  Please follow the below quarantine instructions.   We are sending you home with Tessalon to take every 8 hours as needed for coughing, albuterol to take 1 to 2 puffs every 4-6 hours as needed for shortness of breath/wheezing, as well as doxycycline to cover for treatment of pneumonia.  We have prescribed you new medication(s) today. Discuss the medications prescribed today with your pharmacist as they can have adverse effects and interactions with your other medicines including over the counter and prescribed medications. Seek medical evaluation if you start to experience new or abnormal symptoms after taking one of these medicines, seek care immediately if you start to experience difficulty breathing, feeling of your throat closing, facial swelling, or rash as these could be indications of a more serious allergic reaction  Please take Tylenol per over-the-counter dosing to help with discomfort.  Please follow up with primary care within  3-5 days for re-evaluation- call prior to going to the office to make them aware of your symptoms as some offices are altering their method of seeing patients with COVID 19 symptoms. Return to the ER for new or worsening symptoms including but not limited to increased work of breathing, chest pain, passing out, or any other concerns.   Please have your blood pressure rechecked at your primary care appointment as it was elevated in the emergency department today.  Vitals:   07/22/19 1428 07/22/19 1433  BP: (!) 142/92 (!) 142/92  Pulse:    Resp:    Temp:    SpO2:           Person Under Monitoring Name: Whitney Tanner  Location: [address] [place] [address]   Infection Prevention Recommendations for Individuals Confirmed to have, or Being Evaluated for, 2019 Novel Coronavirus (COVID-19) Infection Who Receive Care at Home  Individuals who are confirmed to have, or are being evaluated for, COVID-19 should follow the prevention steps below until a healthcare provider or local or state health department says they can return to normal activities.  Stay home except to get medical care You should restrict activities outside your home, except for getting medical care. Do not go to work, school, or public areas, and do not use public transportation or taxis.  Call ahead before visiting your doctor Before your medical appointment, call the healthcare provider and tell them that you have, or are being evaluated for, COVID-19 infection. This will help the healthcare provider's office take steps to keep other people from getting   infected. Ask your healthcare provider to call the local or state health department.  Monitor your symptoms Seek prompt medical attention if your illness is worsening (e.g., difficulty breathing). Before going to your medical appointment, call the healthcare provider and tell them that you have, or are being evaluated for, COVID-19 infection.  Ask your healthcare provider to call the local or state health department.  Wear a facemask You should wear a facemask that covers your nose and mouth when you are in the same room with other people and when you visit a healthcare provider. People who live with or visit you should also wear a facemask while they are in the same room with you.  Separate yourself from other people in your home As much as possible, you should stay in a different room from other people in your home. Also, you should use a separate bathroom, if available.  Avoid sharing household items You should not share dishes, drinking glasses, cups, eating utensils, towels, bedding, or other items with other people in your home. After using these items, you should wash them thoroughly with soap and water.  Cover your coughs and sneezes Cover your mouth and nose with a tissue when you cough or sneeze, or you can cough or sneeze into your sleeve. Throw used tissues in a lined trash can, and immediately wash your hands with soap and water for at least 20 seconds or use an alcohol-based hand rub.  Wash your hands Wash your hands often and thoroughly with soap and water for at least 20 seconds. You can use an alcohol-based hand sanitizer if soap and water are not available and if your hands are not visibly dirty. Avoid touching your eyes, nose, and mouth with unwashed hands.   Prevention Steps for Caregivers and Household Members of Individuals Confirmed to have, or Being Evaluated for, COVID-19 Infection Being Cared for in the Home  If you live with, or provide care at home for, a person confirmed to have, or being evaluated for, COVID-19 infection please follow these guidelines to prevent infection:  Follow healthcare provider's instructions Make sure that you understand and can help the patient follow any healthcare provider instructions for all care.  Provide for the patient's basic needs You should help the  patient with basic needs in the home and provide support for getting groceries, prescriptions, and other personal needs.  Monitor the patient's symptoms If they are getting sicker, call his or her medical provider and tell them that the patient has, or is being evaluated for, COVID-19 infection. This will help the healthcare provider's office take steps to keep other people from getting infected. Ask the healthcare provider to call the local or state health department.  Limit the number of people who have contact with the patient If possible, have only one caregiver for the patient. Other household members should stay in another home or place of residence. If this is not possible, they should stay in another room, or be separated from the patient as much as possible. Use a separate bathroom, if available. Restrict visitors who do not have an essential need to be in the home.  Keep older adults, very young children, and other sick people away from the patient Keep older adults, very young children, and those who have compromised immune systems or chronic health conditions away from the patient. This includes people with chronic heart, lung, or kidney conditions, diabetes, and cancer.  Ensure good ventilation Make sure that shared spaces in the home   have good air flow, such as from an air conditioner or an opened window, weather permitting.  Wash your hands often Wash your hands often and thoroughly with soap and water for at least 20 seconds. You can use an alcohol based hand sanitizer if soap and water are not available and if your hands are not visibly dirty. Avoid touching your eyes, nose, and mouth with unwashed hands. Use disposable paper towels to dry your hands. If not available, use dedicated cloth towels and replace them when they become wet.  Wear a facemask and gloves Wear a disposable facemask at all times in the room and gloves when you touch or have contact with the patient's  blood, body fluids, and/or secretions or excretions, such as sweat, saliva, sputum, nasal mucus, vomit, urine, or feces.  Ensure the mask fits over your nose and mouth tightly, and do not touch it during use. Throw out disposable facemasks and gloves after using them. Do not reuse. Wash your hands immediately after removing your facemask and gloves. If your personal clothing becomes contaminated, carefully remove clothing and launder. Wash your hands after handling contaminated clothing. Place all used disposable facemasks, gloves, and other waste in a lined container before disposing them with other household waste. Remove gloves and wash your hands immediately after handling these items.  Do not share dishes, glasses, or other household items with the patient Avoid sharing household items. You should not share dishes, drinking glasses, cups, eating utensils, towels, bedding, or other items with a patient who is confirmed to have, or being evaluated for, COVID-19 infection. After the person uses these items, you should wash them thoroughly with soap and water.  Wash laundry thoroughly Immediately remove and wash clothes or bedding that have blood, body fluids, and/or secretions or excretions, such as sweat, saliva, sputum, nasal mucus, vomit, urine, or feces, on them. Wear gloves when handling laundry from the patient. Read and follow directions on labels of laundry or clothing items and detergent. In general, wash and dry with the warmest temperatures recommended on the label.  Clean all areas the individual has used often Clean all touchable surfaces, such as counters, tabletops, doorknobs, bathroom fixtures, toilets, phones, keyboards, tablets, and bedside tables, every day. Also, clean any surfaces that may have blood, body fluids, and/or secretions or excretions on them. Wear gloves when cleaning surfaces the patient has come in contact with. Use a diluted bleach solution (e.g., dilute  bleach with 1 part bleach and 10 parts water) or a household disinfectant with a label that says EPA-registered for coronaviruses. To make a bleach solution at home, add 1 tablespoon of bleach to 1 quart (4 cups) of water. For a larger supply, add  cup of bleach to 1 gallon (16 cups) of water. Read labels of cleaning products and follow recommendations provided on product labels. Labels contain instructions for safe and effective use of the cleaning product including precautions you should take when applying the product, such as wearing gloves or eye protection and making sure you have good ventilation during use of the product. Remove gloves and wash hands immediately after cleaning.  Monitor yourself for signs and symptoms of illness Caregivers and household members are considered close contacts, should monitor their health, and will be asked to limit movement outside of the home to the extent possible. Follow the monitoring steps for close contacts listed on the symptom monitoring form.   ? If you have additional questions, contact your local health department or call the   epidemiologist on call at [phone] (available 24/7). ? This guidance is subject to change. For the most up-to-date guidance from [facility], please refer to their website: YouBlogs.pl

## 2019-07-23 LAB — SARS CORONAVIRUS 2 (TAT 6-24 HRS): SARS Coronavirus 2: POSITIVE — AB

## 2019-07-24 ENCOUNTER — Telehealth: Payer: Self-pay | Admitting: Unknown Physician Specialty

## 2019-07-24 NOTE — Telephone Encounter (Signed)
Discussed with patient about Covid symptoms and the use of bamlanivimab, a monoclonal antibody infusion for those with mild to moderate Covid symptoms and at a high risk of hospitalization.  Pt is qualified for this infusion at the [facility] infusion center due to BMI>35   She would like to think about it and call me back

## 2020-11-15 ENCOUNTER — Encounter (HOSPITAL_COMMUNITY): Payer: Self-pay

## 2020-11-15 ENCOUNTER — Emergency Department (HOSPITAL_COMMUNITY)
Admission: EM | Admit: 2020-11-15 | Discharge: 2020-11-15 | Disposition: A | Discharge status: Home / Self Care | Payer: 59 | Attending: Emergency Medicine | Admitting: Emergency Medicine

## 2020-11-15 ENCOUNTER — Other Ambulatory Visit: Payer: Self-pay

## 2020-11-15 ENCOUNTER — Emergency Department (HOSPITAL_COMMUNITY): Payer: 59

## 2020-11-15 DIAGNOSIS — R072 Precordial pain: Secondary | ICD-10-CM | POA: Insufficient documentation

## 2020-11-15 DIAGNOSIS — R079 Chest pain, unspecified: Secondary | ICD-10-CM

## 2020-11-15 DIAGNOSIS — R0602 Shortness of breath: Secondary | ICD-10-CM | POA: Insufficient documentation

## 2020-11-15 LAB — CBC
HCT: 39.1 % (ref 36.0–46.0)
Hemoglobin: 12.5 g/dL (ref 12.0–15.0)
MCH: 27.1 pg (ref 26.0–34.0)
MCHC: 32 g/dL (ref 30.0–36.0)
MCV: 84.8 fL (ref 80.0–100.0)
Platelets: 301 10*3/uL (ref 150–400)
RBC: 4.61 MIL/uL (ref 3.87–5.11)
RDW: 13.9 % (ref 11.5–15.5)
WBC: 8 10*3/uL (ref 4.0–10.5)
nRBC: 0 % (ref 0.0–0.2)

## 2020-11-15 LAB — BASIC METABOLIC PANEL
Anion gap: 9 (ref 5–15)
BUN: 8 mg/dL (ref 6–20)
CO2: 28 mmol/L (ref 22–32)
Calcium: 9.7 mg/dL (ref 8.9–10.3)
Chloride: 101 mmol/L (ref 98–111)
Creatinine, Ser: 0.7 mg/dL (ref 0.44–1.00)
GFR, Estimated: >60 mL/min (ref >60)
Glucose, Bld: 72 mg/dL (ref 70–99)
Potassium: 3.8 mmol/L (ref 3.5–5.1)
Sodium: 138 mmol/L (ref 135–145)

## 2020-11-15 LAB — I-STAT BETA HCG BLOOD, ED (MC, WL, AP ONLY): I-stat hCG, quantitative: <5 m[IU]/mL (ref <5)

## 2020-11-15 LAB — D-DIMER, QUANTITATIVE: D-Dimer, Quant: 1.48 ug/mL-FEU — ABNORMAL HIGH (ref 0.00–0.50)

## 2020-11-15 LAB — TROPONIN I (HIGH SENSITIVITY): Troponin I (High Sensitivity): <2 ng/L (ref <18)

## 2020-11-15 MED ORDER — IOHEXOL 350 MG/ML SOLN
75.0000 mL | Freq: Once | INTRAVENOUS | Status: AC | PRN
  Administered 2020-11-15: 75 mL via INTRAVENOUS

## 2020-11-15 NOTE — ED Notes (Signed)
Patient had no questions and AVS reviewed.

## 2020-11-15 NOTE — ED Provider Notes (Signed)
[facility]-EMERGENCY DEPT Provider Note   CSN: [id number] Arrival date & time: 11/15/20  1205     History Chief Complaint  Patient presents with  . Chest Pain  . Shortness of Breath    Whitney Tanner is a 30 y.o. female.   Chest Pain Pain location:  Substernal area and L chest Pain quality: pressure   Pain radiates to:  Does not radiate Pain severity:  Moderate Onset quality:  Gradual Timing:  Constant Progression:  Waxing and waning Chronicity:  New Context: at rest   Relieved by:  Nothing Worsened by:  Nothing Ineffective treatments:  None tried Associated symptoms: shortness of breath   Associated symptoms: no back pain, no cough, no fever, no headache, no nausea, no palpitations and no vomiting   Shortness of Breath Associated symptoms: chest pain   Associated symptoms: no cough, no fever, no headaches, no rash and no vomiting        Past Medical History:  Diagnosis Date  . Bipolar 1 disorder (HCC)   . GERD (gastroesophageal reflux disease)   . Ulcerative colitis [facility])     Patient Active Problem List   Diagnosis Date Noted  . Bipolar affective disorder, mixed, moderate (HCC)     Past Surgical History:  Procedure Laterality Date  . COLONOSCOPY WITH PROPOFOL N/A 05/23/2015   Procedure: COLONOSCOPY WITH PROPOFOL;  Surgeon: Christena Deem, MD;  Location: [facility] ENDOSCOPY;  Service: Endoscopy;  Laterality: N/A;  . ESOPHAGOGASTRODUODENOSCOPY (EGD) WITH PROPOFOL N/A 05/23/2015   Procedure: ESOPHAGOGASTRODUODENOSCOPY (EGD) WITH PROPOFOL;  Surgeon: Christena Deem, MD;  Location: [facility] ENDOSCOPY;  Service: Endoscopy;  Laterality: N/A;     OB History   No obstetric history on file.     Family History  Problem Relation Age of Onset  . Healthy Mother   . Healthy Father     Social History   Tobacco Use  . Smoking status: Never Smoker  . Smokeless tobacco: Never Used  Vaping Use  . Vaping Use: Never used  Substance Use Topics   . Alcohol use: No  . Drug use: No    Home Medications Prior to Admission medications   Medication Sig Start Date End Date Taking? Authorizing Provider  acetaminophen (TYLENOL) 500 MG tablet Take 1,000 mg by mouth every 6 (six) hours as needed for moderate pain.   Yes [provider]  ferrous sulfate 325 (65 FE) MG tablet Take 325 mg by mouth daily with breakfast.   Yes [provider]  LORazepam (ATIVAN) 2 MG tablet Take 2 mg by mouth daily as needed. 08/01/20  Yes [provider]  pantoprazole (PROTONIX) 40 MG tablet Take 40 mg by mouth daily.   Yes [provider]  QUEtiapine (SEROQUEL) 200 MG tablet Take 400 mg by mouth at bedtime.   Yes [provider]  sertraline (ZOLOFT) 100 MG tablet Take 200 mg by mouth daily.   Yes [provider]  temazepam (RESTORIL) 30 MG capsule Take 30 mg by mouth at bedtime. 08/01/20  Yes [provider]  albuterol (VENTOLIN HFA) 108 (90 Base) MCG/ACT inhaler Inhale 1-2 puffs into the lungs every 6 (six) hours as needed for wheezing or shortness of breath. Patient not taking: No sig reported 07/22/19   Petrucelli, Samantha R, PA-C  benzonatate (TESSALON) 100 MG capsule Take 1 capsule (100 mg total) by mouth every 8 (eight) hours. Patient not taking: No sig reported 07/22/19   Petrucelli, Samantha R, PA-C  doxycycline (VIBRAMYCIN) 100  MG capsule Take 1 capsule (100 mg total) by mouth 2 (two) times daily. Patient not taking: No sig reported 07/22/19   Petrucelli, Samantha R, PA-C  oxyCODONE-acetaminophen (ROXICET) 5-325 MG tablet Take 1 tablet by mouth every 4 (four) hours as needed for severe pain. Patient not taking: No sig reported 05/08/15   Darci Current, MD  promethazine (PHENERGAN) 12.5 MG tablet Take 1 tablet (12.5 mg total) by mouth every 6 (six) hours as needed for nausea or vomiting. Patient not taking: No sig reported 05/08/15   Darci Current, MD  traZODone (DESYREL) 100 MG tablet Take 1  tablet (100 mg total) by mouth at bedtime. Patient not taking: No sig reported 06/07/15   Gayla Doss, MD  ziprasidone (GEODON) 60 MG capsule Take 1 capsule (60 mg total) by mouth daily. Take with dinner. Patient not taking: No sig reported 06/07/15   Gayla Doss, MD    Allergies    Oxcarbazepine  Review of Systems   Review of Systems  Constitutional: Negative for chills and fever.  HENT: Negative for congestion and rhinorrhea.   Respiratory: Positive for shortness of breath. Negative for cough.   Cardiovascular: Positive for chest pain. Negative for palpitations.  Gastrointestinal: Negative for diarrhea, nausea and vomiting.  Genitourinary: Negative for difficulty urinating and dysuria.  Musculoskeletal: Negative for arthralgias and back pain.  Skin: Negative for rash and wound.  Neurological: Negative for light-headedness and headaches.    Physical Exam Updated Vital Signs BP (!) 156/88   Pulse 76   Temp 98.2 F (36.8 C) (Oral)   Resp 14   Ht 5\' 6"  (1.676 m)   Wt 111.1 kg   LMP 10/22/2020   SpO2 96%   BMI 39.54 kg/m   Physical Exam Vitals and nursing note reviewed. Exam conducted with a chaperone present.  Constitutional:      General: She is not in acute distress.    Appearance: Normal appearance.  HENT:     Head: Normocephalic and atraumatic.     Nose: No rhinorrhea.  Eyes:     General:        Right eye: No discharge.        Left eye: No discharge.     Conjunctiva/sclera: Conjunctivae normal.  Cardiovascular:     Rate and Rhythm: Normal rate and regular rhythm.     Heart sounds: Normal heart sounds.  Pulmonary:     Effort: Pulmonary effort is normal. No respiratory distress.     Breath sounds: No stridor. No decreased breath sounds or wheezing.  Abdominal:     General: Abdomen is flat. There is no distension.     Palpations: Abdomen is soft.  Musculoskeletal:        General: No tenderness or signs of injury.     Right lower leg: No edema.      Left lower leg: No edema.  Skin:    General: Skin is warm and dry.  Neurological:     General: No focal deficit present.     Mental Status: She is alert. Mental status is at baseline.     Motor: No weakness.  Psychiatric:        Mood and Affect: Mood normal.        Behavior: Behavior normal.     ED Results / Procedures / Treatments   Labs (all labs ordered are listed, but only abnormal results are displayed) Labs Reviewed  D-DIMER, QUANTITATIVE - Abnormal; Notable for the following components:  Result Value   D-Dimer, Quant 1.48 (*)    All other components within normal limits  BASIC METABOLIC PANEL  CBC  I-STAT BETA HCG BLOOD, ED ([facility], [facility], [facility] ONLY)  TROPONIN I (HIGH SENSITIVITY)    EKG EKG Interpretation  Date/Time:  Thursday November 15 2020 12:32:34 EDT Ventricular Rate:  91 PR Interval:  129 QRS Duration: 83 QT Interval:  357 QTC Calculation: 440 R Axis:   25 Text Interpretation: Sinus rhythm Low voltage, precordial leads 12 Lead; Mason-Likar Confirmed by Cherlynn PerchesKatz, Santi Troung ([id number]) on 11/15/2020 3:49:23 PM   Radiology DG Chest 2 View  Result Date: 11/15/2020 CLINICAL DATA:  Chest pain and shortness of breath. Additional history provided: Intermittent pleuritic chest pain with shortness of breath prior PE/DVT, history of 2 canal asthma, COVID positive last year. EXAM: CHEST - 2 VIEW COMPARISON:  Chest radiograph 07/22/2019. FINDINGS: Heart size within normal limits. No appreciable airspace consolidation. No evidence of pleural effusion or pneumothorax. No acute bony abnormality identified. IMPRESSION: No evidence of active cardiopulmonary disease. Electronically Signed   By: Jackey LogeKyle  Golden DO   On: 11/15/2020 12:46   CT Angio Chest PE W and/or Wo Contrast  Result Date: 11/15/2020 CLINICAL DATA:  Shortness of breath and elevated D-dimer level. EXAM: CT ANGIOGRAPHY CHEST WITH CONTRAST TECHNIQUE: Multidetector CT imaging of the chest was performed using the standard protocol during  bolus administration of intravenous contrast. Multiplanar CT image reconstructions and MIPs were obtained to evaluate the vascular anatomy. CONTRAST:  75mL OMNIPAQUE IOHEXOL 350 MG/ML SOLN COMPARISON:  Chest radiograph 11/15/2020 FINDINGS: Despite efforts by the technologist and patient, motion artifact is present on today's exam and could not be eliminated. This along with patient body habitus reduces exam sensitivity and specificity. Cardiovascular: Accounting for the motion artifact, no specific indicators of acute pulmonary embolus. Mild cardiomegaly. No acute aortic findings. Mediastinum/Nodes: Unremarkable Lungs/Pleura: Unremarkable Upper Abdomen: Unremarkable Musculoskeletal: Unremarkable Review of the MIP images confirms the above findings. IMPRESSION: 1. No filling defect is identified in the pulmonary arterial tree to suggest pulmonary embolus. No acute findings are identified. Electronically Signed   By: Gaylyn RongWalter  Liebkemann M.D.   On: 11/15/2020 19:55    Procedures Procedures   Medications Ordered in ED Medications  iohexol (OMNIPAQUE) 350 MG/ML injection 75 mL (75 mLs Intravenous Contrast Given 11/15/20 1647)    ED Course  I have reviewed the triage vital signs and the nursing notes.  Pertinent labs & imaging results that were available during my care of the patient were reviewed by me and considered in my medical decision making (see chart for details).    MDM Rules/Calculators/A&P                         Low risk chest pain EKG as described above.  Chest x-ray no acute cardiopulmonary pathology  after my review and radiology review.  Laboratory studies except for D-dimer unremarkable.  With elevated D-dimer she will get a CT PE study.  Unclear origin of patient's chest pain however concerning signs or symptoms.  She also comments on apneic episodes at night that her family describes.  She is advised to get a sleep study.  CT PE study infiltrated into her arm and had to be repeated.   She has no blistering she has normal pulses mild tenderness to the area.  Repeat CT PE study showed no acute pulmonary embolism or other pathology.  She is resting comfortably.  Troponin is negative x1.  Do  not need to repeat as this is been going on for weeks.  Other labs are unremarkable.  She needs to follow-up outpatient for continued evaluation of this.  Return precautions discussed   Final Clinical Impression(s) / ED Diagnoses Final diagnoses:  Chest pain, unspecified type  SOB (shortness of breath)    Rx / DC Orders ED Discharge Orders    None       Sabino Donovan, MD 11/15/20 2117

## 2020-11-15 NOTE — ED Triage Notes (Signed)
Patient c/o constant chest tightness and SOB x a few weeks. Patient states symptoms are progressively worse.

## 2020-11-15 NOTE — ED Provider Notes (Signed)
Emergency Medicine Provider Triage Evaluation Note  Whitney Tanner , a 30 y.o. female  was evaluated in triage.  Pt complains of sob.  Review of Systems  Positive: Sob, pleuritic cp Negative: Fever, n/v/d, cough  Physical Exam  BP (!) 149/98 (BP Location: Right Arm)   Pulse 89   Temp 98.2 F (36.8 C) (Oral)   Resp 16   Ht 5\' 6"  (1.676 m)   Wt 111.1 kg   LMP 10/22/2020   SpO2 100%   BMI 39.54 kg/m  Gen:   Awake, no distress   Resp:  Normal effort  MSK:   Moves extremities without difficulty  Other:  Obese female, lungs CTAB, no wheezes  Medical Decision Making  Medically screening exam initiated at 12:35 PM.  Appropriate orders placed.  Whitney Tanner was informed that the remainder of the evaluation will be completed by another provider, this initial triage assessment does not replace that evaluation, and the importance of remaining in the ED until their evaluation is complete.  Intermittent pleuritic cp along with SOB worse at night.  Has air conditioner with mold problem at home.  No prior hx of PE/DVT.  Hx of juvenile asthma.  Has had covid in the past.    Whitney Tanner, Whitney Tanner 11/15/20 1237    01/15/21, MD 11/16/20 [phone]

## 2020-11-15 NOTE — ED Notes (Signed)
This nurse was alerted by CT tech that pt's right AC IV infiltrated. IV removed by CT tech and ice pack placed to pt's right arm. Abby, RN notified and aware. CT tech and this RN unable to establish peripheral IV access at this time.

## 2022-06-23 ENCOUNTER — Emergency Department (HOSPITAL_COMMUNITY): Payer: Medicaid Other

## 2022-06-23 ENCOUNTER — Emergency Department (HOSPITAL_COMMUNITY)
Admission: EM | Admit: 2022-06-23 | Discharge: 2022-06-23 | Disposition: A | Discharge status: Home / Self Care | Payer: Medicaid Other | Attending: Emergency Medicine | Admitting: Emergency Medicine

## 2022-06-23 DIAGNOSIS — R1013 Epigastric pain: Secondary | ICD-10-CM | POA: Diagnosis not present

## 2022-06-23 DIAGNOSIS — R112 Nausea with vomiting, unspecified: Secondary | ICD-10-CM | POA: Insufficient documentation

## 2022-06-23 DIAGNOSIS — Z20822 Contact with and (suspected) exposure to covid-19: Secondary | ICD-10-CM | POA: Insufficient documentation

## 2022-06-23 DIAGNOSIS — R197 Diarrhea, unspecified: Secondary | ICD-10-CM | POA: Insufficient documentation

## 2022-06-23 DIAGNOSIS — R000 Tachycardia, unspecified: Secondary | ICD-10-CM | POA: Insufficient documentation

## 2022-06-23 DIAGNOSIS — R1084 Generalized abdominal pain: Secondary | ICD-10-CM | POA: Diagnosis present

## 2022-06-23 LAB — URINALYSIS, ROUTINE W REFLEX MICROSCOPIC
Bacteria, UA: NONE SEEN
Bilirubin Urine: NEGATIVE
Glucose, UA: NEGATIVE mg/dL
Hgb urine dipstick: NEGATIVE
Ketones, ur: 5 mg/dL — AB
Leukocytes,Ua: NEGATIVE
Nitrite: NEGATIVE
Protein, ur: 30 mg/dL — AB
Specific Gravity, Urine: 1.021 (ref 1.005–1.030)
pH: 6 (ref 5.0–8.0)

## 2022-06-23 LAB — COMPREHENSIVE METABOLIC PANEL
ALT: 63 U/L — ABNORMAL HIGH (ref 0–44)
AST: 76 U/L — ABNORMAL HIGH (ref 15–41)
Albumin: 4.1 g/dL (ref 3.5–5.0)
Alkaline Phosphatase: 78 U/L (ref 38–126)
Anion gap: 13 (ref 5–15)
BUN: 13 mg/dL (ref 6–20)
CO2: 20 mmol/L — ABNORMAL LOW (ref 22–32)
Calcium: 8.9 mg/dL (ref 8.9–10.3)
Chloride: 103 mmol/L (ref 98–111)
Creatinine, Ser: 0.74 mg/dL (ref 0.44–1.00)
GFR, Estimated: >60 mL/min (ref >60)
Glucose, Bld: 146 mg/dL — ABNORMAL HIGH (ref 70–99)
Potassium: 4.1 mmol/L (ref 3.5–5.1)
Sodium: 136 mmol/L (ref 135–145)
Total Bilirubin: 1.4 mg/dL — ABNORMAL HIGH (ref 0.3–1.2)
Total Protein: 9.4 g/dL — ABNORMAL HIGH (ref 6.5–8.1)

## 2022-06-23 LAB — CBC WITH DIFFERENTIAL/PLATELET
Abs Immature Granulocytes: 0.04 10*3/uL (ref 0.00–0.07)
Basophils Absolute: 0 10*3/uL (ref 0.0–0.1)
Basophils Relative: 0 %
Eosinophils Absolute: 0 10*3/uL (ref 0.0–0.5)
Eosinophils Relative: 0 %
HCT: 38.6 % (ref 36.0–46.0)
Hemoglobin: 12.6 g/dL (ref 12.0–15.0)
Immature Granulocytes: 0 %
Lymphocytes Relative: 8 %
Lymphs Abs: 0.8 10*3/uL (ref 0.7–4.0)
MCH: 26.4 pg (ref 26.0–34.0)
MCHC: 32.6 g/dL (ref 30.0–36.0)
MCV: 80.9 fL (ref 80.0–100.0)
Monocytes Absolute: 0.3 10*3/uL (ref 0.1–1.0)
Monocytes Relative: 3 %
Neutro Abs: 9.5 10*3/uL — ABNORMAL HIGH (ref 1.7–7.7)
Neutrophils Relative %: 89 %
Platelets: 250 10*3/uL (ref 150–400)
RBC: 4.77 MIL/uL (ref 3.87–5.11)
RDW: 13.8 % (ref 11.5–15.5)
WBC: 10.8 10*3/uL — ABNORMAL HIGH (ref 4.0–10.5)
nRBC: 0 % (ref 0.0–0.2)

## 2022-06-23 LAB — RESP PANEL BY RT-PCR (RSV, FLU A&B, COVID)  RVPGX2
Influenza A by PCR: NEGATIVE
Influenza B by PCR: NEGATIVE
Resp Syncytial Virus by PCR: NEGATIVE
SARS Coronavirus 2 by RT PCR: NEGATIVE

## 2022-06-23 LAB — PREGNANCY, URINE: Preg Test, Ur: NEGATIVE

## 2022-06-23 LAB — LIPASE, BLOOD: Lipase: 44 U/L (ref 11–51)

## 2022-06-23 MED ORDER — MORPHINE SULFATE (PF) 4 MG/ML IV SOLN
4.0000 mg | Freq: Once | INTRAVENOUS | Status: AC
  Administered 2022-06-23: 4 mg via INTRAVENOUS
  Filled 2022-06-23: qty 1

## 2022-06-23 MED ORDER — ONDANSETRON HCL 4 MG/2ML IJ SOLN
4.0000 mg | Freq: Once | INTRAMUSCULAR | Status: AC
  Administered 2022-06-23: 4 mg via INTRAVENOUS
  Filled 2022-06-23: qty 2

## 2022-06-23 MED ORDER — SODIUM CHLORIDE 0.9 % IV BOLUS
1000.0000 mL | Freq: Once | INTRAVENOUS | Status: AC
  Administered 2022-06-23: 1000 mL via INTRAVENOUS

## 2022-06-23 NOTE — Discharge Instructions (Addendum)
Please make an appointment with your GI in regards to recent ER visit. Please continue taking Protonix for acid reflux symptoms and avoid triggers. If symptoms worsen please return to ER.

## 2022-06-23 NOTE — ED Triage Notes (Addendum)
Patient arrived with complaints generalized abdominal pain and NVD since 7pm last night. Attempted to take medications but unable to keep anything down.

## 2022-06-23 NOTE — ED Provider Notes (Signed)
[facility]-EMERGENCY DEPT Provider Note   CSN: [id number] Arrival date & time: 06/23/22  3875     History  Chief Complaint  Patient presents with   Abdominal Pain   *Mom was present to assist in history  Whitney Tanner is a 32 y.o. female, h/o UC and GERD, presented with 12 hr h/o N/V/D.  Patient stated that symptoms began abruptly.  Patient has been unable to keep food/meds/fluids down.  Patient denied any hematemesis or hematochezia.  Patient endorsed 10 out of 10 generalized abdominal pain that radiates throughout the belly and to the back.  Palpation makes the pain worse.  Patient states no sick contacts at home however she does work at a daycare.  Patient denied chest pain, shortness of breath, syncope, changes in sensation/motor skills, dysuria  LMP: 05/23/22   Home Medications Prior to Admission medications   Medication Sig Start Date End Date Taking? Authorizing Provider  acetaminophen (TYLENOL) 500 MG tablet Take 1,000 mg by mouth every 6 (six) hours as needed for moderate pain.    [provider]  albuterol (VENTOLIN HFA) 108 (90 Base) MCG/ACT inhaler Inhale 1-2 puffs into the lungs every 6 (six) hours as needed for wheezing or shortness of breath. Patient not taking: No sig reported 07/22/19   Petrucelli, Samantha R, PA-C  benzonatate (TESSALON) 100 MG capsule Take 1 capsule (100 mg total) by mouth every 8 (eight) hours. Patient not taking: No sig reported 07/22/19   Petrucelli, Samantha R, PA-C  doxycycline (VIBRAMYCIN) 100 MG capsule Take 1 capsule (100 mg total) by mouth 2 (two) times daily. Patient not taking: No sig reported 07/22/19   Petrucelli, Samantha R, PA-C  ferrous sulfate 325 (65 FE) MG tablet Take 325 mg by mouth daily with breakfast.    [provider]  LORazepam (ATIVAN) 2 MG tablet Take 2 mg by mouth daily as needed. 08/01/20   [provider]  oxyCODONE-acetaminophen (ROXICET) 5-325 MG tablet Take 1 tablet by  mouth every 4 (four) hours as needed for severe pain. Patient not taking: No sig reported 05/08/15   Darci Current, MD  pantoprazole (PROTONIX) 40 MG tablet Take 40 mg by mouth daily.    [provider]  promethazine (PHENERGAN) 12.5 MG tablet Take 1 tablet (12.5 mg total) by mouth every 6 (six) hours as needed for nausea or vomiting. Patient not taking: No sig reported 05/08/15   Darci Current, MD  QUEtiapine (SEROQUEL) 200 MG tablet Take 400 mg by mouth at bedtime.    [provider]  sertraline (ZOLOFT) 100 MG tablet Take 200 mg by mouth daily.    [provider]  temazepam (RESTORIL) 30 MG capsule Take 30 mg by mouth at bedtime. 08/01/20   [provider]  traZODone (DESYREL) 100 MG tablet Take 1 tablet (100 mg total) by mouth at bedtime. Patient not taking: No sig reported 06/07/15   Gayla Doss, MD  ziprasidone (GEODON) 60 MG capsule Take 1 capsule (60 mg total) by mouth daily. Take with dinner. Patient not taking: No sig reported 06/07/15   Gayla Doss, MD      Allergies    Oxcarbazepine    Review of Systems   Review of Systems  Gastrointestinal:  Positive for abdominal pain.  See HPI  Physical Exam Updated Vital Signs BP 115/73   Pulse 87   Temp 98.6 F (37 C) (Oral)   Resp 14   LMP 05/23/2022   SpO2 95%  Physical Exam Constitutional:      Appearance: She is ill-appearing.  HENT:     Head: Normocephalic.  Eyes:     Extraocular Movements: Extraocular movements intact.  Cardiovascular:     Rate and Rhythm: Regular rhythm. Tachycardia present.     Heart sounds: Normal heart sounds.  Pulmonary:     Effort: Pulmonary effort is normal.     Breath sounds: Normal breath sounds.  Abdominal:     General: Bowel sounds are normal.     Palpations: Abdomen is soft.     Tenderness: There is abdominal tenderness.     Comments: Initial exam extremely limited by pain  Skin:    General: Skin is warm and dry.     Capillary  Refill: Capillary refill takes less than 2 seconds.  Neurological:     General: No focal deficit present.     Mental Status: She is alert and oriented to person, place, and time.  Psychiatric:        Mood and Affect: Mood normal.     ED Results / Procedures / Treatments   Labs (all labs ordered are listed, but only abnormal results are displayed) Labs Reviewed  CBC WITH DIFFERENTIAL/PLATELET - Abnormal; Notable for the following components:      Result Value   WBC 10.8 (*)    Neutro Abs 9.5 (*)    All other components within normal limits  COMPREHENSIVE METABOLIC PANEL - Abnormal; Notable for the following components:   CO2 20 (*)    Glucose, Bld 146 (*)    Total Protein 9.4 (*)    AST 76 (*)    ALT 63 (*)    Total Bilirubin 1.4 (*)    All other components within normal limits  URINALYSIS, ROUTINE W REFLEX MICROSCOPIC - Abnormal; Notable for the following components:   APPearance HAZY (*)    Ketones, ur 5 (*)    Protein, ur 30 (*)    All other components within normal limits  RESP PANEL BY RT-PCR (RSV, FLU A&B, COVID)  RVPGX2  LIPASE, BLOOD  PREGNANCY, URINE    EKG None  Radiology US Abdomen Limited RUQ (LIVER/GB)  Result Date: 06/23/2022 CLINICAL DATA:  Epigastric pain. EXAM: ULTRASOUND ABDOMEN LIMITED RIGHT UPPER QUADRANT COMPARISON:  Abdominal ultrasound May 08, 2015. Abdominal MRI December 28, 2019. FINDINGS: Gallbladder: No gallstones or wall thickening visualized. No sonographic Murphy sign noted by sonographer. Common bile duct: Diameter: 4 mm Liver: Diffuse increased echogenicity throughout the liver. A mildly complicated cyst is identified in the right hepatic lobe measuring 11 mm. Portal vein is patent on color Doppler imaging with normal direction of blood flow towards the liver. Other: None. IMPRESSION: 1. Diffuse increased echogenicity throughout the liver is consistent with known hepatic steatosis identified on previous imaging. 2. The gallbladder and common  bile duct are normal on this study. Electronically Signed   By: Dorise Bullion III M.D.   On: 06/23/2022 10:58    Procedures Procedures    Medications Ordered in ED Medications  morphine (PF) 4 MG/ML injection 4 mg (4 mg Intravenous Given 06/23/22 0806)  sodium chloride 0.9 % bolus 1,000 mL (0 mLs Intravenous Stopped 06/23/22 1157)  ondansetron (ZOFRAN) injection 4 mg (4 mg Intravenous Given 06/23/22 0806)    ED Course/ Medical Decision Making/ A&P                           Medical Decision Making Amount and/or Complexity  of Data Reviewed Labs: ordered.  Risk Prescription drug management.   Secundino Ginger 32 y.o. presented today for nausea vomiting diarrhea. Working DDx that I considered at this time includes, but not limited to, viral AGE, UC exacerbation, pancreatitis, UTI, pregnancy, sepsis.  Review of prior external notes: 11/15/20 ED Provider  Unique Tests and Interpretation: Resp panel: Negative Lipase: Unremarkable UA: Unremarkable CBC w diff: Unremarkable CMP: Unremarkable UPT: Negative [place] abd RUQ: Hepatic steatosis present however this has been present chronically; no acute changes or stones noted  Discussion with Independent Historian: Mom  Discussion of Management of Tests: None  Risk Stratification Score: None  Staffed with Army Melia, PA-C  R/o DDx: Sepsis: VS stabilized before medical treatment Pregnancy: Negative UPT UTI: UA was negative UC exacerbation: UC is in remission. Also patient endorsed watery diarrhea when UC is commonly more associated with bloody diarrhea thus making this diagnosis less likely.  Plan: Based on patient history and limited physical exam, we will begin with labs and symptom management so a more thorough physical exam can be conducted. Zofran, morphine, and fluids were ordered for symptom management and patient will be reassessed. Suspect the tachycardia, tachypnea, and elevated BP is due to abd pain and dehydration as compared  to CV/resp source. VS improved after symptomatic treatment. Morphine was ordered before UPT due to patient vocalizing and appearing uncomfortable.  On first recheck patient stated she was feeling much better and that the pain had dropped to 1 out of ten.  Patient does not feel that she can tolerate PO at that time.  On second recheck, patient stated there is a dull epigastric pain that was starting to return.  Patient stated that this is most likely from puking and musculoskeletal pain than actual abdominal pain.  Ultrasound came back reassuring for any gallstones or acute changes. Patient is stable at this time but still endorses abd soreness epigastically.  Patient will be PO challenged and if it is successful can be discharged with close followup.  Patient passed PO challenge endorsed symptom improvement.  Patient takes Protonix at home and I educated that she continue that as prescribed.  Patient was also educated on avoiding foods that may trigger acid reflux or belly pain.  Patient was encouraged to follow-up with primary care or GI regarding recent visit.  Patient was also given a work note.  Patient was stable for discharge.  I suspect due to the abrupt onset of symptoms this is most likely a viral illness. Patient was educated about how our respiratory panel only tests for 4 viruses and some do not show up.  Patient was educated that if symptoms worsen, to return to ED. Patient verbalized understanding of plan.   Final Clinical Impression(s) / ED Diagnoses Final diagnoses:  Epigastric pain    Rx / DC Orders ED Discharge Orders     None         Remi Deter 06/23/22 1247    Wynetta Fines, MD 06/23/22 1501
